# Patient Record
Sex: Male | Born: 1965
Health system: Southern US, Community
[De-identification: ages and names within clinical notes are randomized; demographics above are authoritative.]

## PROBLEM LIST (undated history)

## (undated) DIAGNOSIS — I10 Essential (primary) hypertension: Secondary | ICD-10-CM

## (undated) HISTORY — DX: Essential (primary) hypertension: I10

---

## 1988-03-10 HISTORY — PX: HERNIA REPAIR: SHX51

## 2008-03-10 HISTORY — PX: SPINE SURGERY: SHX786

## 2008-12-07 ENCOUNTER — Ambulatory Visit (HOSPITAL_COMMUNITY): Admission: RE | Admit: 2008-12-07 | Discharge: 2008-12-08 | Payer: Self-pay | Admitting: Neurosurgery

## 2010-06-14 LAB — APTT: aPTT: 27 seconds (ref 24–37)

## 2010-06-14 LAB — URINALYSIS, ROUTINE W REFLEX MICROSCOPIC
Bilirubin Urine: NEGATIVE
Glucose, UA: NEGATIVE mg/dL
Hgb urine dipstick: NEGATIVE
Ketones, ur: NEGATIVE mg/dL
Nitrite: NEGATIVE
Protein, ur: NEGATIVE mg/dL
Specific Gravity, Urine: 1.02 (ref 1.005–1.030)
Urobilinogen, UA: 0.2 mg/dL (ref 0.0–1.0)
pH: 5.5 (ref 5.0–8.0)

## 2010-06-14 LAB — BASIC METABOLIC PANEL
BUN: 16 mg/dL (ref 6–23)
CO2: 27 mEq/L (ref 19–32)
Calcium: 9.8 mg/dL (ref 8.4–10.5)
Chloride: 99 mEq/L (ref 96–112)
Creatinine, Ser: 1.18 mg/dL (ref 0.4–1.5)
GFR calc Af Amer: >60 mL/min (ref >60)
GFR calc non Af Amer: >60 mL/min (ref >60)
Glucose, Bld: 119 mg/dL — ABNORMAL HIGH (ref 70–99)
Potassium: 3.9 mEq/L (ref 3.5–5.1)
Sodium: 137 mEq/L (ref 135–145)

## 2010-06-14 LAB — CBC
HCT: 38.2 % — ABNORMAL LOW (ref 39.0–52.0)
Hemoglobin: 12.9 g/dL — ABNORMAL LOW (ref 13.0–17.0)
MCHC: 33.7 g/dL (ref 30.0–36.0)
MCV: 91.4 fL (ref 78.0–100.0)
Platelets: 248 10*3/uL (ref 150–400)
RBC: 4.18 MIL/uL — ABNORMAL LOW (ref 4.22–5.81)
RDW: 12.9 % (ref 11.5–15.5)
WBC: 5 10*3/uL (ref 4.0–10.5)

## 2010-06-14 LAB — URINE MICROSCOPIC-ADD ON

## 2010-06-14 LAB — PROTIME-INR
INR: 0.9 (ref 0.00–1.49)
Prothrombin Time: 11.9 seconds (ref 11.6–15.2)

## 2012-06-04 ENCOUNTER — Other Ambulatory Visit: Payer: Self-pay | Admitting: *Deleted

## 2012-06-04 MED ORDER — AMLODIPINE BESYLATE 10 MG PO TABS: 10.0000 mg | ORAL_TABLET | Freq: Every day | ORAL | Status: DC

## 2012-06-07 ENCOUNTER — Other Ambulatory Visit: Payer: Self-pay

## 2012-06-07 MED ORDER — HYDROCHLOROTHIAZIDE 25 MG PO TABS: 25.0000 mg | ORAL_TABLET | Freq: Every day | ORAL | Status: DC

## 2012-08-05 ENCOUNTER — Telehealth: Payer: Self-pay | Admitting: Family Medicine

## 2012-08-06 MED ORDER — HYDROCHLOROTHIAZIDE 25 MG PO TABS: 25.0000 mg | ORAL_TABLET | Freq: Every day | ORAL | Status: DC

## 2012-08-06 NOTE — Telephone Encounter (Signed)
done

## 2012-09-29 ENCOUNTER — Other Ambulatory Visit: Payer: Self-pay | Admitting: Physician Assistant

## 2012-12-22 ENCOUNTER — Encounter: Payer: Self-pay | Admitting: Family Medicine

## 2012-12-22 ENCOUNTER — Ambulatory Visit (INDEPENDENT_AMBULATORY_CARE_PROVIDER_SITE_OTHER): Payer: 59 | Admitting: Family Medicine

## 2012-12-22 VITALS — BP 143/88 | HR 66 | Temp 98.6°F | Ht 71.0 in | Wt 275.4 lb

## 2012-12-22 DIAGNOSIS — I10 Essential (primary) hypertension: Secondary | ICD-10-CM

## 2012-12-22 DIAGNOSIS — Z0000 Encounter for general adult medical examination without abnormal findings: Secondary | ICD-10-CM

## 2012-12-22 DIAGNOSIS — M109 Gout, unspecified: Secondary | ICD-10-CM

## 2012-12-22 LAB — POCT CBC
Granulocyte percent: 60.1 %G (ref 37–80)
HCT, POC: 38.7 % — AB (ref 43.5–53.7)
Hemoglobin: 13 g/dL — AB (ref 14.1–18.1)
Lymph, poc: 1.9 (ref 0.6–3.4)
MCH, POC: 29.9 pg (ref 27–31.2)
MCHC: 33.6 g/dL (ref 31.8–35.4)
MCV: 88.9 fL (ref 80–97)
MPV: 6.7 fL (ref 0–99.8)
POC Granulocyte: 3.2 (ref 2–6.9)
POC LYMPH PERCENT: 35.7 %L (ref 10–50)
Platelet Count, POC: 263 10*3/uL (ref 142–424)
RBC: 4.4 M/uL — AB (ref 4.69–6.13)
RDW, POC: 13.4 %
WBC: 5.3 10*3/uL (ref 4.6–10.2)

## 2012-12-22 MED ORDER — ALLOPURINOL 100 MG PO TABS: 100.0000 mg | ORAL_TABLET | Freq: Every day | ORAL | Status: DC

## 2012-12-22 MED ORDER — HYDROCHLOROTHIAZIDE 25 MG PO TABS: 25.0000 mg | ORAL_TABLET | Freq: Every day | ORAL | Status: DC

## 2012-12-22 MED ORDER — AMLODIPINE BESYLATE 10 MG PO TABS: 10.0000 mg | ORAL_TABLET | Freq: Every day | ORAL | Status: DC

## 2012-12-22 MED ORDER — METOPROLOL TARTRATE 25 MG PO TABS: 25.0000 mg | ORAL_TABLET | Freq: Two times a day (BID) | ORAL | Status: DC

## 2012-12-22 NOTE — Patient Instructions (Signed)

## 2012-12-22 NOTE — Progress Notes (Signed)
  Subjective:    Patient ID: Frank Herrera, male    DOB: 10/02/1965, 47 y.o.   MRN: [id number]  HPI This 47 y.o. male presents for evaluation of CPE and follow up.  He denies any acute problems.   Review of Systems No chest pain, SOB, HA, dizziness, vision change, N/V, diarrhea, constipation, dysuria, urinary urgency or frequency, myalgias, arthralgias or rash.     Objective:   Physical Exam Vital signs noted  Well developed well nourished male.  HEENT - Head atraumatic Normocephalic                Eyes - PERRLA, Conjuctiva - clear Sclera- Clear EOMI                Ears - EAC's Wnl TM's Wnl Gross Hearing WNL                Nose - Nares patent                 Throat - oropharanx wnl Respiratory - Lungs CTA bilateral Cardiac - RRR S1 and S2 without murmur GI - Abdomen soft Nontender and bowel sounds active x 4 Extremities - No edema. Neuro - Grossly intact.       Assessment & Plan:  Gout - Plan: allopurinol (ZYLOPRIM) 100 MG tablet, Uric acid  Essential hypertension, benign - Plan: amLODipine (NORVASC) 10 MG tablet, hydrochlorothiazide (HYDRODIURIL) 25 MG tablet, metoprolol tartrate (LOPRESSOR) 25 MG tablet, POCT CBC, CMP14+EGFR  Routine general medical examination at a health care facility - Plan: POCT CBC, CMP14+EGFR, Lipid panel, Thyroid Panel With TSH, PSA, total and free  Deatra Canter FNP

## 2012-12-23 LAB — CMP14+EGFR
ALT: 31 IU/L (ref 0–44)
AST: 32 IU/L (ref 0–40)
Albumin/Globulin Ratio: 2 (ref 1.1–2.5)
Albumin: 4.8 g/dL (ref 3.5–5.5)
Alkaline Phosphatase: 87 IU/L (ref 39–117)
BUN/Creatinine Ratio: 15 (ref 9–20)
BUN: 15 mg/dL (ref 6–24)
CO2: 24 mmol/L (ref 18–29)
Calcium: 9.9 mg/dL (ref 8.7–10.2)
Chloride: 100 mmol/L (ref 97–108)
Creatinine, Ser: 1.02 mg/dL (ref 0.76–1.27)
GFR calc Af Amer: 102 mL/min/{1.73_m2} (ref >59)
GFR calc non Af Amer: 88 mL/min/{1.73_m2} (ref >59)
Globulin, Total: 2.4 g/dL (ref 1.5–4.5)
Glucose: 110 mg/dL — ABNORMAL HIGH (ref 65–99)
Potassium: 4.3 mmol/L (ref 3.5–5.2)
Sodium: 141 mmol/L (ref 134–144)
Total Bilirubin: 0.6 mg/dL (ref 0.0–1.2)
Total Protein: 7.2 g/dL (ref 6.0–8.5)

## 2012-12-23 LAB — LIPID PANEL
Chol/HDL Ratio: 3 ratio units (ref 0.0–5.0)
Cholesterol, Total: 175 mg/dL (ref 100–199)
HDL: 59 mg/dL (ref >39)
LDL Calculated: 98 mg/dL (ref 0–99)
Triglycerides: 88 mg/dL (ref 0–149)
VLDL Cholesterol Cal: 18 mg/dL (ref 5–40)

## 2012-12-23 LAB — THYROID PANEL WITH TSH
Free Thyroxine Index: >1.1 (ref 1.2–4.9)
T3 Uptake Ratio: >50 % — ABNORMAL HIGH (ref 24–39)
T4, Total: 2.2 ug/dL — ABNORMAL LOW (ref 4.5–12.0)
TSH: 2.17 u[IU]/mL (ref 0.450–4.500)

## 2012-12-23 LAB — PSA, TOTAL AND FREE
PSA, Free Pct: 35 %
PSA, Free: 0.07 ng/mL
PSA: 0.2 ng/mL (ref 0.0–4.0)

## 2012-12-23 LAB — URIC ACID: Uric Acid: 7.3 mg/dL (ref 3.7–8.6)

## 2012-12-27 ENCOUNTER — Other Ambulatory Visit: Payer: Self-pay | Admitting: Family Medicine

## 2012-12-27 DIAGNOSIS — M109 Gout, unspecified: Secondary | ICD-10-CM

## 2012-12-27 MED ORDER — ALLOPURINOL 100 MG PO TABS: 200.0000 mg | ORAL_TABLET | Freq: Every day | ORAL | Status: DC

## 2013-01-05 ENCOUNTER — Telehealth: Payer: Self-pay | Admitting: Family Medicine

## 2013-06-22 ENCOUNTER — Encounter: Payer: Self-pay | Admitting: Family Medicine

## 2013-06-22 ENCOUNTER — Ambulatory Visit (INDEPENDENT_AMBULATORY_CARE_PROVIDER_SITE_OTHER): Payer: 59 | Admitting: Family Medicine

## 2013-06-22 VITALS — BP 126/82 | HR 58 | Temp 98.1°F | Ht 71.0 in | Wt 279.8 lb

## 2013-06-22 DIAGNOSIS — M109 Gout, unspecified: Secondary | ICD-10-CM

## 2013-06-22 DIAGNOSIS — I10 Essential (primary) hypertension: Secondary | ICD-10-CM

## 2013-06-22 DIAGNOSIS — Z23 Encounter for immunization: Secondary | ICD-10-CM

## 2013-06-22 LAB — POCT CBC
Granulocyte percent: 56.3 %G (ref 37–80)
HCT, POC: 49.5 % (ref 43.5–53.7)
Hemoglobin: 15.8 g/dL (ref 14.1–18.1)
Lymph, poc: 1.6 (ref 0.6–3.4)
MCH, POC: 28.6 pg (ref 27–31.2)
MCHC: 31.8 g/dL (ref 31.8–35.4)
MCV: 89.8 fL (ref 80–97)
MPV: 7.5 fL (ref 0–99.8)
POC Granulocyte: 2.3 (ref 2–6.9)
POC LYMPH PERCENT: 39.6 %L (ref 10–50)
Platelet Count, POC: 185 10*3/uL (ref 142–424)
RBC: 5.5 M/uL (ref 4.69–6.13)
RDW, POC: 13.8 %
WBC: 4.1 10*3/uL — AB (ref 4.6–10.2)

## 2013-06-22 MED ORDER — ALLOPURINOL 100 MG PO TABS: 200.0000 mg | ORAL_TABLET | Freq: Every day | ORAL | Status: DC

## 2013-06-22 NOTE — Progress Notes (Signed)
   Subjective:    Patient ID: Frank Herrera, male    DOB: 09-22-1965, 48 y.o.   MRN: [id number]  HPI  This 48 y.o. male presents for evaluation of hypertension and gout.  He is not having any Gout sx's. His last uric acid was slightly elevated at 7.3.  He did not get his rx for allopurinol $RemoveBeforeD'100mg'MtfhckmTZVMahb$  2po qd and is only taking one a day. He is tolerating his bp medication and has no acute complaints.  Review of Systems    No chest pain, SOB, HA, dizziness, vision change, N/V, diarrhea, constipation, dysuria, urinary urgency or frequency, myalgias, arthralgias or rash.  Objective:   Physical Exam Vital signs noted  Well developed well nourished male.  HEENT - Head atraumatic Normocephalic                Eyes - PERRLA, Conjuctiva - clear Sclera- Clear EOMI                Ears - EAC's Wnl TM's Wnl Gross Hearing WNL                Nose - Nares patent                 Throat - oropharanx wnl Respiratory - Lungs CTA bilateral Cardiac - RRR S1 and S2 without murmur GI - Abdomen soft Nontender and bowel sounds active x 4 Extremities - No edema. Neuro - Grossly intact.       Assessment & Plan:  Gout - Plan: allopurinol (ZYLOPRIM) 100 MG tablet take 2 po qd and will check uric acid next visit.  Essential hypertension, benign - Plan: CMP14+EGFR, POCT CBC - Controlled and continue current regimen with HCTZ, Norvasc, an metoprolol.  Lysbeth Penner FNP

## 2013-06-23 LAB — CMP14+EGFR
ALT: 26 IU/L (ref 0–44)
AST: 28 IU/L (ref 0–40)
Albumin/Globulin Ratio: 2 (ref 1.1–2.5)
Albumin: 4.9 g/dL (ref 3.5–5.5)
Alkaline Phosphatase: 85 IU/L (ref 39–117)
BUN/Creatinine Ratio: 15 (ref 9–20)
BUN: 16 mg/dL (ref 6–24)
CO2: 25 mmol/L (ref 18–29)
Calcium: 9.9 mg/dL (ref 8.7–10.2)
Chloride: 98 mmol/L (ref 97–108)
Creatinine, Ser: 1.09 mg/dL (ref 0.76–1.27)
GFR calc Af Amer: 94 mL/min/{1.73_m2} (ref >59)
GFR calc non Af Amer: 81 mL/min/{1.73_m2} (ref >59)
Globulin, Total: 2.5 g/dL (ref 1.5–4.5)
Glucose: 108 mg/dL — ABNORMAL HIGH (ref 65–99)
Potassium: 4 mmol/L (ref 3.5–5.2)
Sodium: 140 mmol/L (ref 134–144)
Total Bilirubin: 0.5 mg/dL (ref 0.0–1.2)
Total Protein: 7.4 g/dL (ref 6.0–8.5)

## 2013-11-28 ENCOUNTER — Other Ambulatory Visit: Payer: Self-pay | Admitting: Family Medicine

## 2013-12-24 ENCOUNTER — Other Ambulatory Visit: Payer: Self-pay | Admitting: Family Medicine

## 2013-12-26 NOTE — Telephone Encounter (Signed)
Last seen 06/22/13 B Oxford

## 2013-12-28 ENCOUNTER — Other Ambulatory Visit: Payer: Self-pay | Admitting: Family Medicine

## 2013-12-29 NOTE — Telephone Encounter (Signed)
Last seen 06/22/13 B Oxford   

## 2014-01-20 ENCOUNTER — Other Ambulatory Visit: Payer: Self-pay | Admitting: Family Medicine

## 2014-01-29 ENCOUNTER — Other Ambulatory Visit: Payer: Self-pay | Admitting: Family Medicine

## 2014-01-30 NOTE — Telephone Encounter (Signed)
Refilled per protocol, next appt in December

## 2014-02-17 ENCOUNTER — Other Ambulatory Visit: Payer: Self-pay | Admitting: Family Medicine

## 2014-02-22 ENCOUNTER — Encounter: Payer: Self-pay | Admitting: Family Medicine

## 2014-02-22 ENCOUNTER — Encounter (INDEPENDENT_AMBULATORY_CARE_PROVIDER_SITE_OTHER): Payer: Self-pay

## 2014-02-22 ENCOUNTER — Ambulatory Visit (INDEPENDENT_AMBULATORY_CARE_PROVIDER_SITE_OTHER): Payer: 59 | Admitting: *Deleted

## 2014-02-22 ENCOUNTER — Ambulatory Visit (INDEPENDENT_AMBULATORY_CARE_PROVIDER_SITE_OTHER): Payer: 59 | Admitting: Family Medicine

## 2014-02-22 VITALS — BP 136/85 | HR 92 | Temp 97.7°F | Ht 71.0 in | Wt 281.0 lb

## 2014-02-22 DIAGNOSIS — M1000 Idiopathic gout, unspecified site: Secondary | ICD-10-CM

## 2014-02-22 DIAGNOSIS — B351 Tinea unguium: Secondary | ICD-10-CM

## 2014-02-22 DIAGNOSIS — Z23 Encounter for immunization: Secondary | ICD-10-CM

## 2014-02-22 MED ORDER — TERBINAFINE HCL 250 MG PO TABS: 250.0000 mg | ORAL_TABLET | Freq: Every day | ORAL | Status: DC

## 2014-02-22 NOTE — Progress Notes (Signed)
   Subjective:    Patient ID: Frank Herrera, male    DOB: 11/07/1965, 48 y.o.   MRN: [id number]  HPI Patient is here for6 month follow up.  He has hx of gout and hypertension. He has some toenail fungus.  Review of Systems  Constitutional: Negative for fever.  HENT: Negative for ear pain.   Eyes: Negative for discharge.  Respiratory: Negative for cough.   Cardiovascular: Negative for chest pain.  Gastrointestinal: Negative for abdominal distention.  Endocrine: Negative for polyuria.  Genitourinary: Negative for difficulty urinating.  Musculoskeletal: Negative for gait problem and neck pain.  Skin: Negative for color change and rash.  Neurological: Negative for speech difficulty and headaches.  Psychiatric/Behavioral: Negative for agitation.       Objective:    BP 136/85 mmHg  Pulse 92  Temp(Src) 97.7 F (36.5 C) (Oral)  Ht 5\' 11"  (1.803 m)  Wt 281 lb (127.461 kg)  BMI 39.21 kg/m2   Physical Exam  Constitutional: He is oriented to person, place, and time. He appears well-developed and well-nourished.  HENT:  Head: Normocephalic and atraumatic.  Mouth/Throat: Oropharynx is clear and moist.  Eyes: Pupils are equal, round, and reactive to light.  Neck: Normal range of motion. Neck supple.  Cardiovascular: Normal rate and regular rhythm.   No murmur heard. Pulmonary/Chest: Effort normal and breath sounds normal.  Abdominal: Soft. Bowel sounds are normal. There is no tenderness.  Neurological: He is alert and oriented to person, place, and time.  Skin: Skin is warm and dry.  Psychiatric: He has a normal mood and affect.          Assessment & Plan:     ICD-9-CM ICD-10-CM   1. Idiopathic gout, unspecified chronicity, unspecified site 274.9 M10.00 Uric acid  2. Onychomycosis 110.1 B35.1 terbinafine (LAMISIL) 250 MG tablet     Return in about 3 months (around 05/24/2014).  Deatra CanterWilliam J Ayaz Sondgeroth FNP

## 2014-02-23 LAB — URIC ACID: Uric Acid: 6.4 mg/dL (ref 3.7–8.6)

## 2014-02-24 ENCOUNTER — Telehealth: Payer: Self-pay | Admitting: Family Medicine

## 2014-02-24 NOTE — Telephone Encounter (Signed)
-----   Message from Deatra CanterWilliam J Oxford, FNP sent at 02/24/2014 12:18 PM EST ----- Uric acid slightly elevated and would not change allopurinol dose and avoid diet high in purines.

## 2014-02-24 NOTE — Telephone Encounter (Signed)
Pt aware of lab result

## 2014-02-25 ENCOUNTER — Other Ambulatory Visit: Payer: Self-pay | Admitting: Family Medicine

## 2014-02-27 ENCOUNTER — Telehealth: Payer: Self-pay

## 2014-02-27 NOTE — Telephone Encounter (Signed)
lEFT RESULTS ON HOME AM AS PER dpr OF 12/2012

## 2014-02-27 NOTE — Telephone Encounter (Signed)
-----   Message from William J Oxford, FNP sent at 02/24/2014 12:18 PM EST ----- °Uric acid slightly elevated and would not change allopurinol dose and avoid diet high in purines. °

## 2014-03-23 ENCOUNTER — Other Ambulatory Visit: Payer: Self-pay | Admitting: Family Medicine

## 2014-05-28 ENCOUNTER — Other Ambulatory Visit: Payer: Self-pay | Admitting: Family Medicine

## 2014-05-30 ENCOUNTER — Other Ambulatory Visit: Payer: Self-pay | Admitting: Family Medicine

## 2014-06-07 ENCOUNTER — Other Ambulatory Visit: Payer: Self-pay | Admitting: Family Medicine

## 2014-06-20 ENCOUNTER — Other Ambulatory Visit: Payer: Self-pay | Admitting: Family Medicine

## 2014-06-23 ENCOUNTER — Ambulatory Visit (INDEPENDENT_AMBULATORY_CARE_PROVIDER_SITE_OTHER): Payer: 59

## 2014-06-23 ENCOUNTER — Encounter: Payer: Self-pay | Admitting: Family Medicine

## 2014-06-23 ENCOUNTER — Ambulatory Visit (INDEPENDENT_AMBULATORY_CARE_PROVIDER_SITE_OTHER): Payer: 59 | Admitting: Family Medicine

## 2014-06-23 VITALS — BP 136/86 | HR 75 | Temp 97.7°F | Ht 71.0 in | Wt 276.4 lb

## 2014-06-23 DIAGNOSIS — R1010 Upper abdominal pain, unspecified: Secondary | ICD-10-CM | POA: Diagnosis not present

## 2014-06-23 LAB — POCT CBC
Granulocyte percent: 50.7 %G (ref 37–80)
HCT, POC: 42.2 % — AB (ref 43.5–53.7)
Hemoglobin: 13 g/dL — AB (ref 14.1–18.1)
Lymph, poc: 1.4 (ref 0.6–3.4)
MCH, POC: 27.7 pg (ref 27–31.2)
MCHC: 30.9 g/dL — AB (ref 31.8–35.4)
MCV: 89.7 fL (ref 80–97)
MPV: 6 fL (ref 0–99.8)
POC Granulocyte: 1.8 — AB (ref 2–6.9)
POC LYMPH PERCENT: 39.6 %L (ref 10–50)
Platelet Count, POC: 264 10*3/uL (ref 142–424)
RBC: 4.7 M/uL (ref 4.69–6.13)
RDW, POC: 13.5 %
WBC: 3.6 10*3/uL — AB (ref 4.6–10.2)

## 2014-06-23 MED ORDER — HYOSCYAMINE SULFATE 0.125 MG SL SUBL: 0.1250 mg | SUBLINGUAL_TABLET | SUBLINGUAL | Status: DC | PRN

## 2014-06-23 NOTE — Progress Notes (Signed)
Subjective:  Patient ID: Frank Herrera, male    DOB: [birth year]  Age: 49 y.o. MRN: [id number]  CC: Abdominal Pain   HPI Frank Herrera presents for 1 week of abdominal pain. Pain was rather mild through the week until this morning. He was at work about 9:00 this morning when he felt a tightness in his stomach the tingling became more intense and he got nauseous and felt something coming up in his throat as if he was given throughout but then it went down and he actually did not. He has not thrown up but has had the pain persisting now for several hours. 6 or 7 out of 10 pain today. Denies diarrhea. He's not been constipated he may skip a day every now and then though. No fever no chills no sweats. Normal appetite.  History Frank Herrera has a past medical history of Hypertension.   He has past surgical history that includes Spine surgery and Hernia repair.   His family history is not on file.He reports that he has never smoked. He does not have any smokeless tobacco history on file. He reports that he drinks alcohol. He reports that he does not use illicit drugs.  Current Outpatient Prescriptions on File Prior to Visit  Medication Sig Dispense Refill  . allopurinol (ZYLOPRIM) 100 MG tablet TAKE 2 TABLETS (200 MG TOTAL) BY MOUTH DAILY. 60 tablet 1  . amLODipine (NORVASC) 10 MG tablet TAKE 1 TABLET (10 MG TOTAL) BY MOUTH DAILY. 30 tablet 4  . hydrochlorothiazide (HYDRODIURIL) 25 MG tablet TAKE 1 TABLET (25 MG TOTAL) BY MOUTH DAILY. 30 tablet 2  . metoprolol tartrate (LOPRESSOR) 25 MG tablet TAKE 1 TABLET (25 MG TOTAL) BY MOUTH 2 (TWO) TIMES DAILY. 60 tablet 2  . terbinafine (LAMISIL) 250 MG tablet Take 1 tablet (250 mg total) by mouth daily. 30 tablet 3   No current facility-administered medications on file prior to visit.    ROS Review of Systems  Constitutional: Negative for fever, chills and diaphoresis.  Respiratory: Negative for cough and shortness of breath.   Cardiovascular: Negative  for chest pain.  Gastrointestinal: Positive for abdominal pain. Negative for nausea, vomiting, diarrhea, constipation, blood in stool and abdominal distention.  Genitourinary: Negative for dysuria, hematuria and flank pain.  Musculoskeletal: Negative for joint swelling and arthralgias.  Skin: Negative for rash.  Neurological: Negative for dizziness and weakness.  Psychiatric/Behavioral: The patient is not nervous/anxious.     Objective:  BP 136/86 mmHg  Pulse 75  Temp(Src) 97.7 F (36.5 C) (Oral)  Ht $R'5\' 11"'bH$  (1.803 m)  Wt 276 lb 6.4 oz (125.374 kg)  BMI 38.57 kg/m2  BP Readings from Last 3 Encounters:  06/23/14 136/86  02/22/14 136/85  06/22/13 126/82    Wt Readings from Last 3 Encounters:  06/23/14 276 lb 6.4 oz (125.374 kg)  02/22/14 281 lb (127.461 kg)  06/22/13 279 lb 12.8 oz (126.916 kg)     Physical Exam  Constitutional: He appears well-developed and well-nourished.  HENT:  Head: Normocephalic and atraumatic.  Right Ear: Tympanic membrane and external ear normal. No decreased hearing is noted.  Left Ear: Tympanic membrane and external ear normal. No decreased hearing is noted.  Mouth/Throat: No oropharyngeal exudate or posterior oropharyngeal erythema.  Eyes: Pupils are equal, round, and reactive to light.  Neck: Normal range of motion. Neck supple.  Cardiovascular: Normal rate and regular rhythm.   No murmur heard. Pulmonary/Chest: Breath sounds normal. No respiratory distress.  Abdominal: Soft. Bowel sounds  are normal. He exhibits no mass. There is no tenderness.  Vitals reviewed.   No results found for: HGBA1C  Lab Results  Component Value Date   WBC 3.6* 06/23/2014   HGB 13.0* 06/23/2014   HCT 42.2* 06/23/2014   PLT 248 12/07/2008   GLUCOSE 88 06/23/2014   CHOL 175 12/22/2012   TRIG 88 12/22/2012   HDL 59 12/22/2012   LDLCALC 98 12/22/2012   ALT 34 06/23/2014   AST 28 06/23/2014   NA 141 06/23/2014   K 3.8 06/23/2014   CL 98 06/23/2014    CREATININE 0.96 06/23/2014   BUN 13 06/23/2014   CO2 26 06/23/2014   TSH 2.170 12/22/2012   PSA 0.2 12/22/2012   INR 0.9 12/07/2008    Dg Chest 2 View  12/07/2008   Clinical Data: Lumbar HNP, preop.  Cough, congestion.   CHEST - 2 VIEW   Comparison: None   Findings: There are low lung volumes.  Heart is mildly enlarged. No confluent opacities, effusions or edema.  No acute bony abnormality.   IMPRESSION: Low lung volumes.  Mild cardiomegaly.  Provider: Anne Ng  Dg Lumbar Spine 2-3 Views  12/07/2008   Clinical Data: History given of lumbar herniated disc.  Left L4-L5 far left discectomy.   LUMBAR SPINE - 2-3 VIEW   Comparison: None   Findings: Portable lateral localization images were obtained.   Image obtained at 1100 hours shows the tip of a metallic instrument projecting over the spinous process of L4.   Portable cross-table examination at 1130 hours shows metallic instrument projecting at the L4-L5 level.   IMPRESSION: Localization images were obtained.  Provider: Mila Palmer, Lendon Collar   Assessment & Plan:   Frank Herrera was seen today for abdominal pain.  Diagnoses and all orders for this visit:  Pain of upper abdomen Orders: -     POCT CBC -     DG Abd 2 Views; Future -     CMP14+EGFR -     Amylase -     Lipase  Other orders -     hyoscyamine (LEVSIN/SL) 0.125 MG SL tablet; Place 1 tablet (0.125 mg total) under the tongue every 4 (four) hours as needed.   I am having Frank Herrera start on hyoscyamine. I am also having him maintain his terbinafine, amLODipine, hydrochlorothiazide, metoprolol tartrate, and allopurinol.  Meds ordered this encounter  Medications  . hyoscyamine (LEVSIN/SL) 0.125 MG SL tablet    Sig: Place 1 tablet (0.125 mg total) under the tongue every 4 (four) hours as needed.    Dispense:  30 tablet    Refill:  0     Follow-up: Return in about 1 month (around 07/23/2014).  [name], M.D.

## 2014-06-24 LAB — CMP14+EGFR
ALT: 34 IU/L (ref 0–44)
AST: 28 IU/L (ref 0–40)
Albumin/Globulin Ratio: 2 (ref 1.1–2.5)
Albumin: 5.1 g/dL (ref 3.5–5.5)
Alkaline Phosphatase: 99 IU/L (ref 39–117)
BUN/Creatinine Ratio: 14 (ref 9–20)
BUN: 13 mg/dL (ref 6–24)
Bilirubin Total: 0.7 mg/dL (ref 0.0–1.2)
CO2: 26 mmol/L (ref 18–29)
Calcium: 10 mg/dL (ref 8.7–10.2)
Chloride: 98 mmol/L (ref 97–108)
Creatinine, Ser: 0.96 mg/dL (ref 0.76–1.27)
GFR calc Af Amer: 108 mL/min/{1.73_m2} (ref >59)
GFR calc non Af Amer: 94 mL/min/{1.73_m2} (ref >59)
Globulin, Total: 2.5 g/dL (ref 1.5–4.5)
Glucose: 88 mg/dL (ref 65–99)
Potassium: 3.8 mmol/L (ref 3.5–5.2)
Sodium: 141 mmol/L (ref 134–144)
Total Protein: 7.6 g/dL (ref 6.0–8.5)

## 2014-06-24 LAB — LIPASE: Lipase: 41 U/L (ref 0–59)

## 2014-06-24 LAB — AMYLASE: Amylase: 65 U/L (ref 31–124)

## 2014-08-11 ENCOUNTER — Other Ambulatory Visit: Payer: Self-pay | Admitting: Family Medicine

## 2014-08-24 ENCOUNTER — Other Ambulatory Visit: Payer: Self-pay | Admitting: Family Medicine

## 2014-08-29 ENCOUNTER — Other Ambulatory Visit: Payer: Self-pay

## 2014-08-29 MED ORDER — AMLODIPINE BESYLATE 10 MG PO TABS: ORAL_TABLET | ORAL | Status: DC

## 2014-09-08 ENCOUNTER — Other Ambulatory Visit: Payer: Self-pay | Admitting: Family Medicine

## 2014-11-11 ENCOUNTER — Other Ambulatory Visit: Payer: Self-pay | Admitting: Family Medicine

## 2014-12-12 ENCOUNTER — Other Ambulatory Visit: Payer: Self-pay | Admitting: Family Medicine

## 2015-01-12 ENCOUNTER — Other Ambulatory Visit: Payer: Self-pay | Admitting: Family Medicine

## 2015-02-13 ENCOUNTER — Other Ambulatory Visit: Payer: Self-pay | Admitting: Family Medicine

## 2015-02-13 NOTE — Telephone Encounter (Signed)
Last seen 06/23/14  Dr Darlyn ReadStacks

## 2015-03-19 ENCOUNTER — Ambulatory Visit (INDEPENDENT_AMBULATORY_CARE_PROVIDER_SITE_OTHER): Payer: BLUE CROSS/BLUE SHIELD | Admitting: Family Medicine

## 2015-03-19 ENCOUNTER — Other Ambulatory Visit: Payer: Self-pay | Admitting: Family Medicine

## 2015-03-19 ENCOUNTER — Encounter: Payer: Self-pay | Admitting: Family Medicine

## 2015-03-19 VITALS — BP 134/85 | HR 66 | Temp 98.2°F | Ht 71.0 in | Wt 283.2 lb

## 2015-03-19 DIAGNOSIS — Z77011 Contact with and (suspected) exposure to lead: Secondary | ICD-10-CM

## 2015-03-19 DIAGNOSIS — I10 Essential (primary) hypertension: Secondary | ICD-10-CM | POA: Diagnosis not present

## 2015-03-19 DIAGNOSIS — M1000 Idiopathic gout, unspecified site: Secondary | ICD-10-CM | POA: Insufficient documentation

## 2015-03-19 DIAGNOSIS — B351 Tinea unguium: Secondary | ICD-10-CM

## 2015-03-19 HISTORY — DX: Contact with and (suspected) exposure to lead: Z77.011

## 2015-03-19 MED ORDER — ALLOPURINOL 100 MG PO TABS: ORAL_TABLET | ORAL | Status: DC

## 2015-03-19 MED ORDER — HYDROCHLOROTHIAZIDE 25 MG PO TABS: ORAL_TABLET | ORAL | Status: DC

## 2015-03-19 MED ORDER — CICLOPIROX 8 % EX SOLN: Freq: Every day | CUTANEOUS | Status: DC

## 2015-03-19 MED ORDER — AMLODIPINE BESYLATE 10 MG PO TABS: ORAL_TABLET | ORAL | Status: DC

## 2015-03-19 MED ORDER — METOPROLOL TARTRATE 25 MG PO TABS
25.0000 mg | ORAL_TABLET | Freq: Two times a day (BID) | ORAL | Status: DC
Start: 2015-03-19 — End: 2015-04-19

## 2015-03-19 NOTE — Progress Notes (Signed)
Subjective:  Patient ID: Frank Herrera, male    DOB: 03/21/1965  Age: 50 y.o. MRN: [id number]  CC: Hypertension and Gout   HPI Frank Herrera presents for  follow-up of hypertension. Patient has no history of headache chest pain or shortness of breath or recent cough. Patient also denies symptoms of TIA such as numbness weakness lateralizing. Patient checks  blood pressure at home and has not had any elevated readings recently. Patient denies side effects from his medication. States taking it regularly.   History Frank Herrera has a past medical history of Hypertension.   He has past surgical history that includes Spine surgery and Hernia repair.   His family history is not on file.He reports that he has never smoked. He does not have any smokeless tobacco history on file. He reports that he drinks alcohol. He reports that he does not use illicit drugs.  Current Outpatient Prescriptions on File Prior to Visit  Medication Sig Dispense Refill  . hyoscyamine (LEVSIN/SL) 0.125 MG SL tablet Place 1 tablet (0.125 mg total) under the tongue every 4 (four) hours as needed. 30 tablet 0   No current facility-administered medications on file prior to visit.    ROS Review of Systems  Constitutional: Negative for fever, chills, diaphoresis and unexpected weight change.  HENT: Negative for congestion, hearing loss, rhinorrhea and sore throat.   Eyes: Negative for visual disturbance.  Respiratory: Negative for cough and shortness of breath.   Cardiovascular: Negative for chest pain.  Gastrointestinal: Negative for abdominal pain, diarrhea and constipation.  Genitourinary: Negative for dysuria and flank pain.  Musculoskeletal: Negative for joint swelling and arthralgias.  Skin: Negative for rash.  Neurological: Negative for dizziness and headaches.  Psychiatric/Behavioral: Negative for sleep disturbance and dysphoric mood.    Objective:  BP 134/85 mmHg  Pulse 66  Temp(Src) 98.2 F (36.8 C)  (Oral)  Ht 5' 11" (1.803 m)  Wt 283 lb 3.2 oz (128.459 kg)  BMI 39.52 kg/m2  SpO2 98%  BP Readings from Last 3 Encounters:  03/19/15 134/85  06/23/14 136/86  02/22/14 136/85    Wt Readings from Last 3 Encounters:  03/19/15 283 lb 3.2 oz (128.459 kg)  06/23/14 276 lb 6.4 oz (125.374 kg)  02/22/14 281 lb (127.461 kg)     Physical Exam  Constitutional: He is oriented to person, place, and time. He appears well-developed and well-nourished. No distress.  HENT:  Head: Normocephalic and atraumatic.  Right Ear: External ear normal.  Left Ear: External ear normal.  Nose: Nose normal.  Mouth/Throat: Oropharynx is clear and moist.  Eyes: Conjunctivae and EOM are normal. Pupils are equal, round, and reactive to light.  Neck: Normal range of motion. Neck supple. No thyromegaly present.  Cardiovascular: Normal rate, regular rhythm and normal heart sounds.   No murmur heard. Pulmonary/Chest: Effort normal and breath sounds normal. No respiratory distress. He has no wheezes. He has no rales.  Abdominal: Soft. Bowel sounds are normal. He exhibits no distension. There is no tenderness.  Lymphadenopathy:    He has no cervical adenopathy.  Neurological: He is alert and oriented to person, place, and time. He has normal reflexes.  Skin: Skin is warm and dry.  Psychiatric: He has a normal mood and affect. His behavior is normal. Judgment and thought content normal.    No results found for: HGBA1C  Lab Results  Component Value Date   WBC 3.6* 06/23/2014   HGB 13.0* 06/23/2014   HCT 42.2* 06/23/2014  PLT 248 12/07/2008   GLUCOSE 88 06/23/2014   CHOL 175 12/22/2012   TRIG 88 12/22/2012   HDL 59 12/22/2012   LDLCALC 98 12/22/2012   ALT 34 06/23/2014   AST 28 06/23/2014   NA 141 06/23/2014   K 3.8 06/23/2014   CL 98 06/23/2014   CREATININE 0.96 06/23/2014   BUN 13 06/23/2014   CO2 26 06/23/2014   TSH 2.170 12/22/2012   PSA 0.2 12/22/2012   INR 0.9 12/07/2008    Dg Chest 2  View  12/07/2008  Clinical Data: Lumbar HNP, preop.  Cough, congestion.  CHEST - 2 VIEW  Comparison: None  Findings: There are low lung volumes.  Heart is mildly enlarged. No confluent opacities, effusions or edema.  No acute bony abnormality.  IMPRESSION: Low lung volumes.  Mild cardiomegaly. Provider: Anne Ng  Dg Lumbar Spine 2-3 Views  12/07/2008  Clinical Data: History given of lumbar herniated disc.  Left L4-L5 far left discectomy.  LUMBAR SPINE - 2-3 VIEW  Comparison: None  Findings: Portable lateral localization images were obtained.  Image obtained at 1100 hours shows the tip of a metallic instrument projecting over the spinous process of L4.  Portable cross-table examination at 1130 hours shows metallic instrument projecting at the L4-L5 level.  IMPRESSION: Localization images were obtained. Provider: Mila Palmer, Lendon Collar   Assessment & Plan:   Frank Herrera was seen today for hypertension and gout.  Diagnoses and all orders for this visit:  Essential hypertension -     CBC with Differential/Platelet -     CMP14+EGFR -     Lipid panel  Idiopathic gout, unspecified chronicity, unspecified site  Onychomycosis -     ciclopirox (PENLAC) 8 % solution; Apply topically at bedtime. Apply over nail and surrounding skin. Apply daily over previous coat. After seven (7) days, may remove with alcohol and continue cycle.  Lead exposure -     Cancel: Lead, blood (adult age 76 yrs or greater) -     Lead, blood (adult age 1 yrs or greater)  Morbid obesity due to excess calories (HCC)  Other orders -     Discontinue: hydrochlorothiazide (HYDRODIURIL) 25 MG tablet; TAKE 1 TABLET (25 MG TOTAL) BY MOUTH DAILY. -     Discontinue: amLODipine (NORVASC) 10 MG tablet; TAKE 1 TABLET (10 MG TOTAL) BY MOUTH DAILY. -     Discontinue: allopurinol (ZYLOPRIM) 100 MG tablet; TAKE 2 TABLETS (200 MG TOTAL) BY MOUTH DAILY. -     allopurinol (ZYLOPRIM) 100 MG tablet; TAKE 2 TABLETS (200 MG TOTAL) BY  MOUTH DAILY. -     amLODipine (NORVASC) 10 MG tablet; TAKE 1 TABLET (10 MG TOTAL) BY MOUTH DAILY. -     hydrochlorothiazide (HYDRODIURIL) 25 MG tablet; TAKE 1 TABLET (25 MG TOTAL) BY MOUTH DAILY. -     metoprolol tartrate (LOPRESSOR) 25 MG tablet; Take 1 tablet (25 mg total) by mouth 2 (two) times daily.   I have discontinued Mr. Curl terbinafine. I am also having him start on ciclopirox. Additionally, I am having him maintain his hyoscyamine, allopurinol, amLODipine, hydrochlorothiazide, and metoprolol tartrate.  Meds ordered this encounter  Medications  . DISCONTD: hydrochlorothiazide (HYDRODIURIL) 25 MG tablet    Sig: TAKE 1 TABLET (25 MG TOTAL) BY MOUTH DAILY.    Dispense:  90 tablet    Refill:  3  . DISCONTD: amLODipine (NORVASC) 10 MG tablet    Sig: TAKE 1 TABLET (10 MG TOTAL) BY MOUTH DAILY.    Dispense:  30 tablet    Refill:  3  . DISCONTD: allopurinol (ZYLOPRIM) 100 MG tablet    Sig: TAKE 2 TABLETS (200 MG TOTAL) BY MOUTH DAILY.    Dispense:  60 tablet    Refill:  1  . allopurinol (ZYLOPRIM) 100 MG tablet    Sig: TAKE 2 TABLETS (200 MG TOTAL) BY MOUTH DAILY.    Dispense:  60 tablet    Refill:  5  . amLODipine (NORVASC) 10 MG tablet    Sig: TAKE 1 TABLET (10 MG TOTAL) BY MOUTH DAILY.    Dispense:  30 tablet    Refill:  5  . hydrochlorothiazide (HYDRODIURIL) 25 MG tablet    Sig: TAKE 1 TABLET (25 MG TOTAL) BY MOUTH DAILY.    Dispense:  90 tablet    Refill:  3  . metoprolol tartrate (LOPRESSOR) 25 MG tablet    Sig: Take 1 tablet (25 mg total) by mouth 2 (two) times daily.    Dispense:  60 tablet    Refill:  5  . ciclopirox (PENLAC) 8 % solution    Sig: Apply topically at bedtime. Apply over nail and surrounding skin. Apply daily over previous coat. After seven (7) days, may remove with alcohol and continue cycle.    Dispense:  6.6 mL    Refill:  0     Follow-up: Return in about 6 months (around 09/16/2015) for CPE, hypertension.  [name], M.D.

## 2015-03-19 NOTE — Patient Instructions (Signed)

## 2015-03-20 LAB — LIPID PANEL
Chol/HDL Ratio: 3.7 ratio units (ref 0.0–5.0)
Cholesterol, Total: 188 mg/dL (ref 100–199)
HDL: 51 mg/dL (ref >39)
LDL Calculated: 118 mg/dL — ABNORMAL HIGH (ref 0–99)
Triglycerides: 94 mg/dL (ref 0–149)
VLDL Cholesterol Cal: 19 mg/dL (ref 5–40)

## 2015-03-20 LAB — CMP14+EGFR
ALT: 28 IU/L (ref 0–44)
AST: 25 IU/L (ref 0–40)
Albumin/Globulin Ratio: 1.6 (ref 1.1–2.5)
Albumin: 4.4 g/dL (ref 3.5–5.5)
Alkaline Phosphatase: 90 IU/L (ref 39–117)
BUN/Creatinine Ratio: 12 (ref 9–20)
BUN: 12 mg/dL (ref 6–24)
Bilirubin Total: 0.6 mg/dL (ref 0.0–1.2)
CO2: 25 mmol/L (ref 18–29)
Calcium: 9.6 mg/dL (ref 8.7–10.2)
Chloride: 98 mmol/L (ref 96–106)
Creatinine, Ser: 1.04 mg/dL (ref 0.76–1.27)
GFR calc Af Amer: 98 mL/min/{1.73_m2} (ref >59)
GFR calc non Af Amer: 85 mL/min/{1.73_m2} (ref >59)
Globulin, Total: 2.8 g/dL (ref 1.5–4.5)
Glucose: 95 mg/dL (ref 65–99)
Potassium: 4.1 mmol/L (ref 3.5–5.2)
Sodium: 140 mmol/L (ref 134–144)
Total Protein: 7.2 g/dL (ref 6.0–8.5)

## 2015-03-20 LAB — CBC WITH DIFFERENTIAL/PLATELET
Basophils Absolute: 0 10*3/uL (ref 0.0–0.2)
Basos: 1 %
EOS (ABSOLUTE): 0.2 10*3/uL (ref 0.0–0.4)
Eos: 4 %
Hematocrit: 38.2 % (ref 37.5–51.0)
Hemoglobin: 13.3 g/dL (ref 12.6–17.7)
Immature Grans (Abs): 0 10*3/uL (ref 0.0–0.1)
Immature Granulocytes: 0 %
Lymphocytes Absolute: 1.7 10*3/uL (ref 0.7–3.1)
Lymphs: 40 %
MCH: 30.1 pg (ref 26.6–33.0)
MCHC: 34.8 g/dL (ref 31.5–35.7)
MCV: 86 fL (ref 79–97)
Monocytes Absolute: 0.4 10*3/uL (ref 0.1–0.9)
Monocytes: 11 %
Neutrophils Absolute: 1.9 10*3/uL (ref 1.4–7.0)
Neutrophils: 44 %
Platelets: 282 10*3/uL (ref 150–379)
RBC: 4.42 x10E6/uL (ref 4.14–5.80)
RDW: 12.9 % (ref 12.3–15.4)
WBC: 4.1 10*3/uL (ref 3.4–10.8)

## 2015-03-21 LAB — LEAD, BLOOD (ADULT >= 16 YRS): Lead-Whole Blood: NOT DETECTED ug/dL (ref 0–19)

## 2015-04-03 ENCOUNTER — Ambulatory Visit (INDEPENDENT_AMBULATORY_CARE_PROVIDER_SITE_OTHER): Payer: BLUE CROSS/BLUE SHIELD | Admitting: Family Medicine

## 2015-04-03 ENCOUNTER — Encounter: Payer: Self-pay | Admitting: Family Medicine

## 2015-04-03 ENCOUNTER — Ambulatory Visit (INDEPENDENT_AMBULATORY_CARE_PROVIDER_SITE_OTHER): Payer: BLUE CROSS/BLUE SHIELD

## 2015-04-03 VITALS — BP 128/87 | HR 62 | Temp 98.6°F | Ht 71.0 in | Wt 279.8 lb

## 2015-04-03 DIAGNOSIS — M25552 Pain in left hip: Secondary | ICD-10-CM

## 2015-04-03 MED ORDER — DICLOFENAC SODIUM 75 MG PO TBEC: 75.0000 mg | DELAYED_RELEASE_TABLET | Freq: Two times a day (BID) | ORAL | Status: DC

## 2015-04-03 NOTE — Progress Notes (Signed)
Subjective:  Patient ID: Frank Herrera, male    DOB: [birth year]  Age: 50 y.o. MRN: [id number]  CC: Hip Pain   HPI Frank Herrera presents for left hip pain for 6 mos. Accelerating over last 2 months. Popping with flexion over acetabulum (Pt. Points) Causes a limp.    History Frank Herrera has a past medical history of Hypertension.   He has past surgical history that includes Spine surgery and Hernia repair.   His family history is not on file.He reports that he has never smoked. He does not have any smokeless tobacco history on file. He reports that he drinks alcohol. He reports that he does not use illicit drugs.    ROS Review of Systems  Constitutional: Negative for fever, chills and diaphoresis.  HENT: Negative for rhinorrhea and sore throat.   Respiratory: Negative for cough and shortness of breath.   Cardiovascular: Negative for chest pain.  Gastrointestinal: Negative for abdominal pain.  Musculoskeletal: Positive for arthralgias.  Skin: Negative for rash.  Neurological: Negative for weakness and headaches.    Objective:  BP 128/87 mmHg  Pulse 62  Temp(Src) 98.6 F (37 C) (Oral)  Ht  (1.803 m)  Wt 279 lb 12.8 oz (126.916 kg)  BMI 39.04 kg/m2  SpO2 100%  BP Readings from Last 3 Encounters:  04/03/15 128/87  03/19/15 134/85  06/23/14 136/86    Wt Readings from Last 3 Encounters:  04/03/15 279 lb 12.8 oz (126.916 kg)  03/19/15 283 lb 3.2 oz (128.459 kg)  06/23/14 276 lb 6.4 oz (125.374 kg)     Physical Exam  Constitutional: He appears well-developed and well-nourished.  HENT:  Head: Normocephalic and atraumatic.  Right Ear: Tympanic membrane and external ear normal. No decreased hearing is noted.  Left Ear: Tympanic membrane and external ear normal. No decreased hearing is noted.  Mouth/Throat: No oropharyngeal exudate or posterior oropharyngeal erythema.  Eyes: Pupils are equal, round, and reactive to light.  Neck: Normal range of motion. Neck  supple.  Cardiovascular: Normal rate and regular rhythm.   No murmur heard. Pulmonary/Chest: Effort normal and breath sounds normal. No respiratory distress.  Abdominal: Soft. Bowel sounds are normal. He exhibits no mass. There is no tenderness.  Musculoskeletal: He exhibits tenderness (at left hip over the greater trochanter laterally).  Vitals reviewed.    Lab Results  Component Value Date   WBC 4.1 03/19/2015   HGB 13.0* 06/23/2014   HCT 38.2 03/19/2015   PLT 282 03/19/2015   GLUCOSE 95 03/19/2015   CHOL 188 03/19/2015   TRIG 94 03/19/2015   HDL 51 03/19/2015   LDLCALC 118* 03/19/2015   ALT 28 03/19/2015   AST 25 03/19/2015   NA 140 03/19/2015   K 4.1 03/19/2015   CL 98 03/19/2015   CREATININE 1.04 03/19/2015   BUN 12 03/19/2015   CO2 25 03/19/2015   TSH 2.170 12/22/2012   PSA 0.2 12/22/2012   INR 0.9 12/07/2008    Dg Chest 2 View  12/07/2008  Clinical Data: Lumbar HNP, preop.  Cough, congestion.  CHEST - 2 VIEW  Comparison: None  Findings: There are low lung volumes.  Heart is mildly enlarged. No confluent opacities, effusions or edema.  No acute bony abnormality.  IMPRESSION: Low lung volumes.  Mild cardiomegaly. Provider: Sherol Dade  Dg Lumbar Spine 2-3 Views  12/07/2008  Clinical Data: History given of lumbar herniated disc.  Left L4-L5 far left discectomy.  LUMBAR SPINE - 2-3 VIEW  Comparison: None  Findings: Portable lateral localization images were obtained.  Image obtained at 1100 hours shows the tip of a metallic instrument projecting over the spinous process of L4.  Portable cross-table examination at 1130 hours shows metallic instrument projecting at the L4-L5 level.  IMPRESSION: Localization images were obtained. Provider: Rachael Darby, Wendi Maya   Assessment & Plan:   Frank Herrera was seen today for hip pain.  Diagnoses and all orders for this visit:  Pain in left hip -     DG Pelvis 1-2 Views; Future -     Ambulatory referral to Physical  Therapy  Morbid obesity due to excess calories (HCC) -     Ambulatory referral to Physical Therapy  Other orders -     diclofenac (VOLTAREN) 75 MG EC tablet; Take 1 tablet (75 mg total) by mouth 2 (two) times daily.      I am having Mr. Debes start on diclofenac. I am also having him maintain his hyoscyamine, allopurinol, amLODipine, hydrochlorothiazide, metoprolol tartrate, and ciclopirox.  Meds ordered this encounter  Medications  . diclofenac (VOLTAREN) 75 MG EC tablet    Sig: Take 1 tablet (75 mg total) by mouth 2 (two) times daily.    Dispense:  60 tablet    Refill:  2     Follow-up: Return in about 2 weeks (around 04/17/2015).  Mechele Claude, M.D.

## 2015-04-17 ENCOUNTER — Ambulatory Visit: Payer: BLUE CROSS/BLUE SHIELD | Attending: Family Medicine | Admitting: Physical Therapy

## 2015-04-17 DIAGNOSIS — R29898 Other symptoms and signs involving the musculoskeletal system: Secondary | ICD-10-CM | POA: Diagnosis not present

## 2015-04-17 DIAGNOSIS — M259 Joint disorder, unspecified: Secondary | ICD-10-CM | POA: Diagnosis present

## 2015-04-17 DIAGNOSIS — R531 Weakness: Secondary | ICD-10-CM | POA: Diagnosis present

## 2015-04-17 DIAGNOSIS — R2681 Unsteadiness on feet: Secondary | ICD-10-CM

## 2015-04-17 NOTE — Therapy (Signed)
[facility] [address] [place], [place], [address] Phone: [phone]   Fax:  [phone]  Physical Therapy Evaluation  Patient Details  Name: Frank Herrera MRN: [id number] Date of Birth: September 04, 1965 Referring Provider: Mechele Claude MD  Encounter Date: 04/17/2015      PT End of Session - 04/17/15 1237    Visit Number 1   Number of Visits 12   Date for PT Re-Evaluation 06/05/15   PT Start Time 0903   PT Stop Time 0950   PT Time Calculation (min) 47 min   Activity Tolerance Patient tolerated treatment well   Behavior During Therapy [facility] for tasks assessed/performed      Past Medical History  Diagnosis Date  . Hypertension     Past Surgical History  Procedure Laterality Date  . Spine surgery    . Hernia repair      There were no vitals filed for this visit.  Visit Diagnosis:  Weakness of right hip - Plan: PT plan of care cert/re-cert  Weakness - Plan: PT plan of care cert/re-cert  Ankle weakness - Plan: PT plan of care cert/re-cert  Unsteadiness - Plan: PT plan of care cert/re-cert      Subjective Assessment - 04/17/15 1223    Subjective I have to be careful how I walk or I will fall.   Limitations Walking   Patient Stated Goals Want to increase my left leg strength.   Currently in Pain? No/denies            [facility] PT Assessment - 04/17/15 0001    Assessment   Medical Diagnosis Pain in left hip.   Referring Provider Mechele Claude MD   Onset Date/Surgical Date --  6-8 months.   Precautions   Precautions Fall   Restrictions   Weight Bearing Restrictions No   Balance Screen   Has the patient fallen in the past 6 months Yes   How many times? 4   Has the patient had a decrease in activity level because of a fear of falling?  Yes   Is the patient reluctant to leave their home because of a fear of falling?  Yes   Home Environment   Living Environment Private residence   Prior Function   Level of  Independence Independent   Cognition   Overall Cognitive Status Within Functional Limits for tasks assessed   Posture/Postural Control   Posture/Postural Control No significant limitations   ROM / Strength   AROM / PROM / Strength AROM;Strength   AROM   Overall AROM Comments Full active left hip range of motion.   Strength   Overall Strength Comments Right hip flexion and abduction= 3+/5; right knee flexion and extension= 4-/5 and right ankle dorsiflexion= 2/5.   Palpation   Palpation comment No palpable left hip pain.   Ambulation/Gait   Gait Pattern Decreased dorsiflexion - left;Left steppage;Scissoring;Ataxic;Trendelenburg;Poor foot clearance - left                   [facility] Adult PT Treatment/Exercise - 04/17/15 0001    Exercises   Exercises Knee/Hip   Knee/Hip Exercises: Aerobic   Nustep Level 3 x 15 minutes.                     PT Long Term Goals - 04/17/15 1233    PT LONG TERM GOAL #1   Title Eliminate a steppage gait.   Time 6   Period Weeks   Status New  PT LONG TERM GOAL #2   Title Right hip and knee strength= 4+/5.   Time 6   Period Weeks   Status New   PT LONG TERM GOAL #3   Title Right ankle strength= 4/5 to avoid toe drag during the gait cycle.     Time 6   Period Weeks   Status New   PT LONG TERM GOAL #4   Title Eliminate a Trendelenburg gait cycle.               Plan - 04/17/15 1224    Clinical Impression Statement The patient has had ongoing left LE weakness for several years.  He has  a related PMH of a lumbar surgery that included pain and weakness in his left LE.  He reports that over the last 6-8 months hisright LE symptoms have worsened.  He has had 4 occasions in which he has hit his left toe on the floor and tripped and fell.   Pt will benefit from skilled therapeutic intervention in order to improve on the following deficits Pain;Decreased activity tolerance;Decreased strength;Decreased coordination   Rehab  Potential Good   PT Frequency 2x / week   PT Duration 6 weeks   PT Treatment/Interventions Therapeutic exercise;Therapeutic activities;Gait training;Balance training;Neuromuscular re-education;Patient/family education   PT Next Visit Plan Left LE strengthening (ankle, knee and hip); ankle isolator; sdly hip abduction; "clamshell"; XTS side-stepping; knee machine; BOSU; dynadisc; wall slides.   Consulted and Agree with Plan of Care Patient         Problem List Patient Active Problem List   Diagnosis Date Noted  . Essential hypertension 03/19/2015  . Primary gout 03/19/2015  . Onychomycosis 03/19/2015  . Lead exposure 03/19/2015  . Severe obesity (BMI >= 40) (HCC) 03/19/2015    Frank Herrera, [place] MPT 04/17/2015, 12:58 PM  [facility] [address] [place], [place], [address] Phone: [phone]   Fax:  [phone]  Name: Frank Herrera MRN: [id number] Date of Birth: April 02, 1965

## 2015-04-19 ENCOUNTER — Other Ambulatory Visit: Payer: Self-pay | Admitting: *Deleted

## 2015-04-19 MED ORDER — METOPROLOL TARTRATE 25 MG PO TABS: 25.0000 mg | ORAL_TABLET | Freq: Two times a day (BID) | ORAL | Status: DC

## 2015-04-19 MED ORDER — AMLODIPINE BESYLATE 10 MG PO TABS: ORAL_TABLET | ORAL | Status: DC

## 2015-04-19 MED ORDER — ALLOPURINOL 100 MG PO TABS: ORAL_TABLET | ORAL | Status: DC

## 2015-04-20 ENCOUNTER — Ambulatory Visit: Payer: BLUE CROSS/BLUE SHIELD | Admitting: Physical Therapy

## 2015-04-20 ENCOUNTER — Encounter: Payer: Self-pay | Admitting: Physical Therapy

## 2015-04-20 DIAGNOSIS — R531 Weakness: Secondary | ICD-10-CM

## 2015-04-20 DIAGNOSIS — R29898 Other symptoms and signs involving the musculoskeletal system: Secondary | ICD-10-CM

## 2015-04-20 DIAGNOSIS — R2681 Unsteadiness on feet: Secondary | ICD-10-CM

## 2015-04-20 NOTE — Therapy (Signed)
[facility] [address] [place], [place], [address] Phone: [phone]   Fax:  [phone]  Physical Therapy Treatment  Patient Details  Name: Frank Herrera MRN: [id number] Date of Birth: 01/20/1966 Referring Provider: Mechele Claude MD  Encounter Date: 04/20/2015      PT End of Session - 04/20/15 0733    Visit Number 2   Number of Visits 12   Date for PT Re-Evaluation 06/05/15   PT Start Time 0732   PT Stop Time 0812   PT Time Calculation (min) 40 min   Activity Tolerance Patient tolerated treatment well   Behavior During Therapy [facility] for tasks assessed/performed      Past Medical History  Diagnosis Date  . Hypertension     Past Surgical History  Procedure Laterality Date  . Spine surgery    . Hernia repair      There were no vitals filed for this visit.  Visit Diagnosis:  Weakness of right hip  Weakness  Ankle weakness  Unsteadiness      Subjective Assessment - 04/20/15 0732    Subjective Reports still limping but has no pain.   Limitations Walking   Patient Stated Goals Want to increase my left leg strength.   Currently in Pain? No/denies            [facility] PT Assessment - 04/20/15 0001    Assessment   Medical Diagnosis Pain in left hip.   Next MD Visit unscheduled   Precautions   Precautions Fall   Restrictions   Weight Bearing Restrictions No                     [facility] Adult PT Treatment/Exercise - 04/20/15 0001    Exercises   Exercises Knee/Hip;Ankle   Knee/Hip Exercises: Aerobic   Nustep L4 x15 min   Knee/Hip Exercises: Seated   Long Arc Quad Strengthening;Left;2 sets;10 reps;Weights   Long Arc Quad Weight 3 lbs.   Marching AROM;Both;2 sets;10 reps   Knee/Hip Exercises: Supine   Short Arc Quad Sets Strengthening;Left;2 sets;10 reps   Short Arc Quad Sets Limitations 5 sec hold with 2#   Geologist, engineering sets;10 reps   Straight Leg Raises Strengthening;Left;2 sets;10  reps   Straight Leg Raise with External Rotation Strengthening;Left;2 sets;10 reps   Knee/Hip Exercises: Sidelying   Hip ABduction Strengthening;Left;2 sets;10 reps   Clams R clamshell 2x10 reps   Knee/Hip Exercises: Prone   Hamstring Curl 2 sets;10 reps   Hamstring Curl Limitations 3#   Hip Extension Strengthening;Left;2 sets;10 reps   Ankle Exercises: Seated   Other Seated Ankle Exercises Attempted ankle isolator and AROM L ankle DF but no muscle contraction    Other Seated Ankle Exercises Seated L ankle dynadisc in DF/Inv/Ev x7 min   Ankle Exercises: Supine   Other Supine Ankle Exercises AAROM L ankle DF x15 reps  no muscle contraction observed                     PT Long Term Goals - 04/17/15 1233    PT LONG TERM GOAL #1   Title Eliminate a steppage gait.   Time 6   Period Weeks   Status New   PT LONG TERM GOAL #2   Title Right hip and knee strength= 4+/5.   Time 6   Period Weeks   Status New   PT LONG TERM GOAL #3   Title Right ankle strength= 4/5 to avoid toe drag  during the gait cycle.     Time 6   Period Weeks   Status New   PT LONG TERM GOAL #4   Title Eliminate a Trendelenburg gait cycle.               Plan - 04/20/15 0814    Clinical Impression Statement Patient tolerated today's treatment well although he demonstrated increased weakness and lack of full L ankle control. Patient cannot recall a previous injury other than previous back surgery per patient report. Patient demonstrated decreased L dorsiflexion strength with lack of L Anterior Tibialis muscle action. With AROM L ankle DF in sitting as well as AROM and AAROM L ankle DF in supine lack of L Anterior Tibialis actvity or contraction was noted. Patient demostrated decreased L hip and knee strength as well although L ankle strength is the greatest deficit. Patient denied pain with any of the exercises completed during today's treatment. L ankle DF was attempted in sitting with Dynadisc in  efforts to increase L ankle DF and improve L Anterior Tibialis activity. L ankle evertor activity was noted near proximal attachment and minimal L Anterior Tibialis activity noted near the proximal attachment with Dynadisc activity. Patient verbalized understanding of attempting simillar exercise with towel as was completed with Dynadisc in clinic.   Pt will benefit from skilled therapeutic intervention in order to improve on the following deficits Pain;Decreased activity tolerance;Decreased strength;Decreased coordination   Rehab Potential Good   PT Frequency 2x / week   PT Duration 6 weeks   PT Treatment/Interventions Therapeutic exercise;Therapeutic activities;Gait training;Balance training;Neuromuscular re-education;Patient/family education   PT Next Visit Plan Continue LLE strengthening with focus on L ankle DF per MPT POC. Consider communicating with MPT regarding VMS to L Anterior Tibialis next treatment.   PT Home Exercise Plan HEP- towel L ankle ROM in sitting   Consulted and Agree with Plan of Care Patient        Problem List Patient Active Problem List   Diagnosis Date Noted  . Essential hypertension 03/19/2015  . Primary gout 03/19/2015  . Onychomycosis 03/19/2015  . Lead exposure 03/19/2015  . Severe obesity (BMI >= 40) (HCC) 03/19/2015    Evelene Croon, PTA 04/20/2015, 1:13 PM  [facility] [address] [place], [place], [address] Phone: [phone]   Fax:  [phone]  Name: Frank Herrera MRN: [id number] Date of Birth: 27-Jun-1965

## 2015-04-25 ENCOUNTER — Ambulatory Visit: Payer: BLUE CROSS/BLUE SHIELD | Admitting: Physical Therapy

## 2015-04-25 ENCOUNTER — Encounter: Payer: Self-pay | Admitting: Physical Therapy

## 2015-04-25 DIAGNOSIS — R531 Weakness: Secondary | ICD-10-CM

## 2015-04-25 DIAGNOSIS — R29898 Other symptoms and signs involving the musculoskeletal system: Secondary | ICD-10-CM | POA: Diagnosis not present

## 2015-04-25 DIAGNOSIS — R2681 Unsteadiness on feet: Secondary | ICD-10-CM

## 2015-04-25 NOTE — Therapy (Signed)
[facility] [address] [place], [place], [address] Phone: [phone]   Fax:  [phone]  Physical Therapy Treatment  Patient Details  Name: RIESE HELLARD MRN: [id number] Date of Birth: 1965/12/26 Referring Provider: Mechele Claude MD  Encounter Date: 04/25/2015      PT End of Session - 04/25/15 0811    Visit Number 3   Number of Visits 12   Date for PT Re-Evaluation 06/05/15   PT Start Time 0810   PT Stop Time 0851   PT Time Calculation (min) 41 min   Activity Tolerance Patient tolerated treatment well   Behavior During Therapy [facility] for tasks assessed/performed      Past Medical History  Diagnosis Date  . Hypertension     Past Surgical History  Procedure Laterality Date  . Spine surgery    . Hernia repair      There were no vitals filed for this visit.  Visit Diagnosis:  Weakness of right hip  Weakness  Ankle weakness  Unsteadiness      Subjective Assessment - 04/25/15 0812    Subjective Reports that he has been doing the exercises at home but has seen no improvement.   Limitations Walking   Patient Stated Goals Want to increase my left leg strength.   Currently in Pain? No/denies            [facility] PT Assessment - 04/25/15 0001    Assessment   Medical Diagnosis Pain in left hip.   Next MD Visit unscheduled   Precautions   Precautions Fall   Restrictions   Weight Bearing Restrictions No                     [facility] Adult PT Treatment/Exercise - 04/25/15 0001    Knee/Hip Exercises: Aerobic   Nustep L5 x15 min   Knee/Hip Exercises: Standing   Walking with Sports Cord Pink XTS semi circle x20 reps each   Knee/Hip Exercises: Seated   Long Arc Quad Strengthening;Left;10 reps;Weights;2 sets   Long Arc Quad Weight 4 lbs.   Knee/Hip Flexion x20 reps with 4# ankleweight to LLE   Knee/Hip Exercises: Supine   Short Arc Quad Sets Strengthening;Left;2 sets;10 reps   Short Arc Quad Sets  Limitations with ball squeeze and 4# weight to LLE   [employer] Strengthening;2 sets;10 reps   Straight Leg Raises Strengthening;Left;2 sets;10 reps   Straight Leg Raise with External Rotation Strengthening;Left;2 sets;10 reps   Knee/Hip Exercises: Sidelying   Hip ABduction Strengthening;Left;2 sets;10 reps   Clams L clamshell 2x10 reps   Knee/Hip Exercises: Prone   Hamstring Curl 2 sets;10 reps   Hamstring Curl Limitations 4#   Hip Extension Strengthening;Left;2 sets;10 reps   Ankle Exercises: Seated   Other Seated Ankle Exercises Seated rockerboard x5 min   Other Seated Ankle Exercises Seated L ankle dynadisc into PF/DF and circles x5 min                     PT Long Term Goals - 04/17/15 1233    PT LONG TERM GOAL #1   Title Eliminate a steppage gait.   Time 6   Period Weeks   Status New   PT LONG TERM GOAL #2   Title Right hip and knee strength= 4+/5.   Time 6   Period Weeks   Status New   PT LONG TERM GOAL #3   Title Right ankle strength= 4/5 to avoid toe drag during the  gait cycle.     Time 6   Period Weeks   Status New   PT LONG TERM GOAL #4   Title Eliminate a Trendelenburg gait cycle.               Plan - 04/25/15 0853    Clinical Impression Statement Patient tolerated today's treatment well with no reports of pain in LLE and continues to demonstrate DF weakness of L ankle. Patient has no current tingling or shooting pain into LLE per patient report but prior to low back surgery previous patient stated his pain ran along his L shin and had that approximately 6 months prior to surgery. All hip and knee exercises are completed well with no signifiant area of weakness. With seated ankle exercises patient continues to lack L DF activation and was notable decreased with DF while on rockerboard. With resisted Pink XTS semi circle walks patient's L ankle went into PF when not weightbearing on LLE. Patient denied pain only faitgue following treatment.   Pt will  benefit from skilled therapeutic intervention in order to improve on the following deficits Pain;Decreased activity tolerance;Decreased strength;Decreased coordination   Rehab Potential Good   PT Frequency 2x / week   PT Duration 6 weeks   PT Treatment/Interventions Therapeutic exercise;Therapeutic activities;Gait training;Balance training;Neuromuscular re-education;Patient/family education   PT Next Visit Plan Continue LLE strengthening with focus on L ankle DF per MPT POC. Consider communicating with MPT regarding VMS to L Anterior Tibialis next treatment.   PT Home Exercise Plan HEP- towel L ankle ROM in sitting   Consulted and Agree with Plan of Care Patient        Problem List Patient Active Problem List   Diagnosis Date Noted  . Essential hypertension 03/19/2015  . Primary gout 03/19/2015  . Onychomycosis 03/19/2015  . Lead exposure 03/19/2015  . Severe obesity (BMI >= 40) (HCC) 03/19/2015    Evelene Croon, PTA 04/25/2015, 8:58 AM  [facility] [address] [place], [place], [address] Phone: [phone]   Fax:  [phone]  Name: ANAY RATHE MRN: [id number] Date of Birth: May 25, 1965

## 2015-04-30 ENCOUNTER — Encounter: Payer: Self-pay | Admitting: Physical Therapy

## 2015-04-30 ENCOUNTER — Ambulatory Visit: Payer: BLUE CROSS/BLUE SHIELD | Admitting: Physical Therapy

## 2015-04-30 DIAGNOSIS — R531 Weakness: Secondary | ICD-10-CM

## 2015-04-30 DIAGNOSIS — R29898 Other symptoms and signs involving the musculoskeletal system: Secondary | ICD-10-CM

## 2015-04-30 DIAGNOSIS — R2681 Unsteadiness on feet: Secondary | ICD-10-CM

## 2015-04-30 NOTE — Therapy (Signed)
[facility] [address] [place], [place], [address] Phone: [phone]   Fax:  [phone]  Physical Therapy Treatment  Patient Details  Name: Frank Herrera MRN: [id number] Date of Birth: [birth year] Referring Provider: Mechele Claude MD  Encounter Date: 04/30/2015      PT End of Session - 04/30/15 0815    Visit Number 4   Number of Visits 12   Date for PT Re-Evaluation 06/05/15   PT Start Time 0815   PT Stop Time 0905   PT Time Calculation (min) 50 min   Activity Tolerance Patient tolerated treatment well   Behavior During Therapy [facility] for tasks assessed/performed      Past Medical History  Diagnosis Date  . Hypertension     Past Surgical History  Procedure Laterality Date  . Spine surgery    . Hernia repair      There were no vitals filed for this visit.  Visit Diagnosis:  Weakness of right hip  Weakness  Ankle weakness  Unsteadiness      Subjective Assessment - 04/30/15 0826    Subjective Has not returned to MD at this time secondary to having jury duty and having more PT before he sees him.   Limitations Walking   Patient Stated Goals Want to increase my left leg strength.   Currently in Pain? No/denies            [facility] PT Assessment - 04/30/15 0001    Assessment   Medical Diagnosis Pain in left hip.   Next MD Visit unscheduled   Precautions   Precautions Fall   Restrictions   Weight Bearing Restrictions No                     [facility] Adult PT Treatment/Exercise - 04/30/15 0001    Knee/Hip Exercises: Aerobic   Nustep L6 x15 min   Knee/Hip Exercises: Seated   Long Arc Quad Strengthening;Left;3 sets;10 reps;Weights   Long Arc Quad Weight 4 lbs.   Knee/Hip Flexion x20 reps with 4# ankleweight to LLE   Knee/Hip Exercises: Supine   Bridges Strengthening;2 sets;10 reps   Straight Leg Raises Strengthening;Left;2 sets;10 reps   Knee/Hip Exercises: Sidelying   Hip ABduction  Strengthening;Left;2 sets;10 reps   Clams L clamshell 2x10 reps   Knee/Hip Exercises: Prone   Hamstring Curl 2 sets;10 reps   Hamstring Curl Limitations 4#   Hip Extension Strengthening;Left;2 sets;10 reps   Modalities   Modalities Electrical Stimulation   Electrical Stimulation   Electrical Stimulation Location L Tibialis Anterior   Electrical Stimulation Action VMS   Electrical Stimulation Parameters 10/10, 50 pps, 100usec x15 min with L ankle DF   Electrical Stimulation Goals Strength   Ankle Exercises: Seated   Other Seated Ankle Exercises Seated rockerboard x5 min   Other Seated Ankle Exercises --   Ankle Exercises: Supine   Other Supine Ankle Exercises AROM L ankle DF with VMS x15 min                     PT Long Term Goals - 04/17/15 1233    PT LONG TERM GOAL #1   Title Eliminate a steppage gait.   Time 6   Period Weeks   Status New   PT LONG TERM GOAL #2   Title Right hip and knee strength= 4+/5.   Time 6   Period Weeks   Status New   PT LONG TERM GOAL #3   Title  Right ankle strength= 4/5 to avoid toe drag during the gait cycle.     Time 6   Period Weeks   Status New   PT LONG TERM GOAL #4   Title Eliminate a Trendelenburg gait cycle.               Plan - 04/30/15 0853    Clinical Impression Statement Patient tolerated today's treatment well with no reports of pain in LLE and continues to have lack of active L ankle DF. Patient has no L hip pain with any ADLs per patient report only difficulty with L ankle. Completed all L hip exercises well without pain or complaint of soreness with minimal VC with proper exercise. VMS to L Tibialis Anterior completed with MPT consent in efforts of improving L ankle DF. L Tibialis Anterior contraction was noted with VMS and active L ankle DF in supine and patient was encouraged to try exaggerating L ankle DF to improve ROM. Normal stimulation response noted following removal of the modality.  Educated ptient to  be aware of L ankle weakness following stimulation. Patient denied pain following today's treatment per patient report.   Pt will benefit from skilled therapeutic intervention in order to improve on the following deficits Pain;Decreased activity tolerance;Decreased strength;Decreased coordination   Rehab Potential Good   PT Frequency 2x / week   PT Duration 6 weeks   PT Treatment/Interventions Therapeutic exercise;Therapeutic activities;Gait training;Balance training;Neuromuscular re-education;Patient/family education;Electrical Stimulation   PT Next Visit Plan Continue LLE strengthening with focus on L ankle DF per MPT POC. Continue VMS to L anterior tib to improve strength and ROM.   PT Home Exercise Plan HEP- towel L ankle ROM in sitting   Consulted and Agree with Plan of Care Patient        Problem List Patient Active Problem List   Diagnosis Date Noted  . Essential hypertension 03/19/2015  . Primary gout 03/19/2015  . Onychomycosis 03/19/2015  . Lead exposure 03/19/2015  . Severe obesity (BMI >= 40) (HCC) 03/19/2015    Evelene Croon, PTA 04/30/2015, 9:10 AM  [facility] [address] [place], [place], [address] Phone: [phone]   Fax:  [phone]  Name: Frank Herrera MRN: [id number] Date of Birth: May 31, 1965

## 2015-05-03 ENCOUNTER — Ambulatory Visit: Payer: BLUE CROSS/BLUE SHIELD | Admitting: Physical Therapy

## 2015-05-03 ENCOUNTER — Encounter: Payer: Self-pay | Admitting: Physical Therapy

## 2015-05-03 DIAGNOSIS — R531 Weakness: Secondary | ICD-10-CM

## 2015-05-03 DIAGNOSIS — R2681 Unsteadiness on feet: Secondary | ICD-10-CM

## 2015-05-03 DIAGNOSIS — R29898 Other symptoms and signs involving the musculoskeletal system: Secondary | ICD-10-CM | POA: Diagnosis not present

## 2015-05-03 NOTE — Therapy (Signed)
[facility] [address] [place], [place], [address] Phone: [phone]   Fax:  [phone]  Physical Therapy Treatment  Patient Details  Name: DEUCE PATERNOSTER MRN: [id number] Date of Birth: December 10, 1965 Referring Provider: Mechele Claude MD  Encounter Date: 05/03/2015      PT End of Session - 05/03/15 0850    Visit Number 5   Number of Visits 12   Date for PT Re-Evaluation 06/05/15   PT Start Time 0814   PT Stop Time 0900   PT Time Calculation (min) 46 min   Activity Tolerance Patient tolerated treatment well   Behavior During Therapy [facility] for tasks assessed/performed      Past Medical History  Diagnosis Date  . Hypertension     Past Surgical History  Procedure Laterality Date  . Spine surgery    . Hernia repair      There were no vitals filed for this visit.  Visit Diagnosis:  Weakness of right hip  Weakness  Ankle weakness  Unsteadiness      Subjective Assessment - 05/03/15 0819    Subjective Patient reported doing good after last treatment and has no pain complaints.    Limitations Walking   Patient Stated Goals Want to increase my left leg strength.   Currently in Pain? No/denies                         [facility] Adult PT Treatment/Exercise - 05/03/15 0001    Knee/Hip Exercises: Aerobic   Nustep L6 x15 min   Knee/Hip Exercises: Machines for Strengthening   Cybex Knee Extension 20# 3x10   Cybex Knee Flexion 40# 3x10   Knee/Hip Exercises: Standing   Walking with Sports Cord Pink XTS semi circle x20 reps each   Electrical Stimulation   Electrical Stimulation Location L Tibialis Anterior   Electrical Stimulation Action VMS   Electrical Stimulation Parameters 10/10 x55min with self AROM for DF with orange strap to assist    Electrical Stimulation Goals Strength;Neuromuscular facilitation                PT Education - 05/03/15 0856    Education provided Yes   Education Details HEP   Person(s) Educated Patient   Methods Explanation;Demonstration;Handout   Comprehension Verbalized understanding;Returned demonstration             PT Long Term Goals - 04/17/15 1233    PT LONG TERM GOAL #1   Title Eliminate a steppage gait.   Time 6   Period Weeks   Status New   PT LONG TERM GOAL #2   Title Right hip and knee strength= 4+/5.   Time 6   Period Weeks   Status New   PT LONG TERM GOAL #3   Title Right ankle strength= 4/5 to avoid toe drag during the gait cycle.     Time 6   Period Weeks   Status New   PT LONG TERM GOAL #4   Title Eliminate a Trendelenburg gait cycle.               Plan - 05/03/15 0856    Clinical Impression Statement Patient progressing with all activities. Patient has no pain reports and feels good progress with strength exercises thus far. Patient has episodes of falling to floor due to foot weakness. Patient was given HEP for strengthenng today. Unable to meet any further goals dur to weakness.   Pt will benefit from skilled therapeutic  intervention in order to improve on the following deficits Pain;Decreased activity tolerance;Decreased strength;Decreased coordination   Rehab Potential Good   PT Frequency 2x / week   PT Duration 6 weeks   PT Treatment/Interventions Therapeutic exercise;Therapeutic activities;Gait training;Balance training;Neuromuscular re-education;Patient/family education;Electrical Stimulation   PT Next Visit Plan Continue LLE strengthening with focus on L ankle DF per MPT POC. Continue VMS to L anterior tib to improve strength and ROM.   PT Home Exercise Plan HEP- towel L ankle ROM in sitting   Consulted and Agree with Plan of Care Patient        Problem List Patient Active Problem List   Diagnosis Date Noted  . Essential hypertension 03/19/2015  . Primary gout 03/19/2015  . Onychomycosis 03/19/2015  . Lead exposure 03/19/2015  . Severe obesity (BMI >= 40) (HCC) 03/19/2015    Rosella Crandell P ,  PTA  05/03/2015, 9:03 AM  [facility] [address] [place], [place], [address] Phone: [phone]   Fax:  [phone]  Name: BRAN ALDRIDGE MRN: [id number] Date of Birth: 06/03/1965

## 2015-05-03 NOTE — Patient Instructions (Signed)
Dorsiflexion: Isometric    With ball or rolled pillow between feet, squeeze feet together. Hold ___10_ seconds. Relax. Repeat _10___ times per set. Do _1-2___ sets per session. Do __2-3__ sessions per day.  http://orth.exer.us/2   Copyright  VHI. All rights reserved.  Strengthening: Straight Leg Raise (Phase 2)    Resting on forearms, tighten muscles on front of left thigh, then lift leg __5__ inches from surface, keeping knee locked. Repeat _10___ times per set. Do _3___ sets per session. Do __2-3__ sessions per day.  http://orth.exer.us/616   Copyright  VHI. All rights reserved.  Hip Abduction: Modified    Lying on right side with pillow between thighs, raise top leg from pillow, rotating slightly out. Repeat _10___ times per set. Do _2___ sets per session. Do 2-3____ sessions per day.  http://orth.exer.us/704   Copyright  VHI. All rights reserved.

## 2015-05-09 ENCOUNTER — Encounter: Payer: Self-pay | Admitting: Physical Therapy

## 2015-05-09 ENCOUNTER — Ambulatory Visit: Payer: BLUE CROSS/BLUE SHIELD | Attending: Family Medicine | Admitting: Physical Therapy

## 2015-05-09 DIAGNOSIS — R531 Weakness: Secondary | ICD-10-CM | POA: Insufficient documentation

## 2015-05-09 DIAGNOSIS — M259 Joint disorder, unspecified: Secondary | ICD-10-CM | POA: Diagnosis present

## 2015-05-09 DIAGNOSIS — R2681 Unsteadiness on feet: Secondary | ICD-10-CM | POA: Diagnosis present

## 2015-05-09 DIAGNOSIS — R29898 Other symptoms and signs involving the musculoskeletal system: Secondary | ICD-10-CM | POA: Diagnosis present

## 2015-05-09 NOTE — Therapy (Signed)
[facility] [address] [place], [place], [address] Phone: [phone]   Fax:  [phone]  Physical Therapy Treatment  Patient Details  Name: Frank Herrera MRN: [id number] Date of Birth: 11/09/1965 Referring Provider: Mechele Claude MD  Encounter Date: 05/09/2015      PT End of Session - 05/09/15 0847    Visit Number 6   Number of Visits 12   Date for PT Re-Evaluation 06/05/15   PT Start Time 0814   PT Stop Time 0859   PT Time Calculation (min) 45 min   Activity Tolerance Patient tolerated treatment well   Behavior During Therapy [facility] for tasks assessed/performed      Past Medical History  Diagnosis Date  . Hypertension     Past Surgical History  Procedure Laterality Date  . Spine surgery    . Hernia repair      There were no vitals filed for this visit.  Visit Diagnosis:  Weakness of right hip  Weakness  Ankle weakness  Unsteadiness      Subjective Assessment - 05/09/15 0820    Subjective Patient has reported being sore from doing some work, yet no pain today.   Limitations Walking   Patient Stated Goals Want to increase my left leg strength.   Currently in Pain? No/denies                         [facility] Adult PT Treatment/Exercise - 05/09/15 0001    Knee/Hip Exercises: Aerobic   Nustep L6 x15 min, monitored for activity tolerance   Knee/Hip Exercises: Machines for Strengthening   Cybex Knee Extension 20# 3x10   Cybex Knee Flexion 40# 3x10   Knee/Hip Exercises: Standing   Lateral Step Up 10 reps;Hand Hold: 2;Step Height: 6";3 sets;Left   Forward Step Up Left;10 reps;Hand Hold: 2;3 sets;Step Height: 6"   Knee/Hip Exercises: Supine   Bridges Strengthening;Both;2 sets;10 reps  with red swiss for straight leg hip bridge   Electrical Stimulation   Electrical Stimulation Location L Tibialis Anterior   Electrical Stimulation Action VMS   Electrical Stimulation Parameters 10x10 x83min with  orange strap for assist   Electrical Stimulation Goals Strength;Neuromuscular facilitation                     PT Long Term Goals - 05/09/15 0849    PT LONG TERM GOAL #1   Title Eliminate a steppage gait.   Time 6   Period Weeks   Status On-going   PT LONG TERM GOAL #2   Title Right hip and knee strength= 4+/5.   Time 6   Period Weeks   Status On-going   PT LONG TERM GOAL #3   Title Right ankle strength= 4/5 to avoid toe drag during the gait cycle.     Time 6   Period Weeks   Status On-going   PT LONG TERM GOAL #4   Title Eliminate a Trendelenburg gait cycle.   Time 6   Period Weeks   Status On-going               Plan - 05/09/15 0850    Clinical Impression Statement Patient progressing with strengthening exercises and has no pain reports. Patient showing muscle activation in left tib ant with DF using VMS yet needs assistance for full range in supine. Patient has a steppage gait pattern. Unable to meet any further goals due to strength deficit.   Pt will  benefit from skilled therapeutic intervention in order to improve on the following deficits Pain;Decreased activity tolerance;Decreased strength;Decreased coordination   Rehab Potential Good   PT Frequency 2x / week   PT Duration 6 weeks   PT Treatment/Interventions Therapeutic exercise;Therapeutic activities;Gait training;Balance training;Neuromuscular re-education;Patient/family education;Electrical Stimulation   PT Next Visit Plan Continue LLE strengthening with focus on L ankle DF per MPT POC. Continue VMS to L anterior tib to improve strength and ROM.   Consulted and Agree with Plan of Care Patient        Problem List Patient Active Problem List   Diagnosis Date Noted  . Essential hypertension 03/19/2015  . Primary gout 03/19/2015  . Onychomycosis 03/19/2015  . Lead exposure 03/19/2015  . Severe obesity (BMI >= 40) (HCC) 03/19/2015    Alassane Kalafut P, PTA  05/09/2015, 8:59 AM  [facility] [address] [place], [place], [address] Phone: [phone]   Fax:  [phone]  Name: Frank Herrera MRN: [id number] Date of Birth: 08/10/1965

## 2015-05-14 ENCOUNTER — Ambulatory Visit: Payer: BLUE CROSS/BLUE SHIELD | Admitting: Physical Therapy

## 2015-05-14 ENCOUNTER — Encounter: Payer: Self-pay | Admitting: Physical Therapy

## 2015-05-14 DIAGNOSIS — R2681 Unsteadiness on feet: Secondary | ICD-10-CM

## 2015-05-14 DIAGNOSIS — R531 Weakness: Secondary | ICD-10-CM

## 2015-05-14 DIAGNOSIS — R29898 Other symptoms and signs involving the musculoskeletal system: Secondary | ICD-10-CM | POA: Diagnosis not present

## 2015-05-14 NOTE — Therapy (Signed)
[facility] [address] [place], [place], [address] Phone: [phone]   Fax:  [phone]  Physical Therapy Treatment  Patient Details  Name: Frank Herrera MRN: [id number] Date of Birth: 01/23/1966 Referring Provider: Mechele Claude MD  Encounter Date: 05/14/2015      PT End of Session - 05/14/15 0840    Visit Number 7   Number of Visits 12   Date for PT Re-Evaluation 06/05/15   PT Start Time 0814   PT Stop Time 0900   PT Time Calculation (min) 46 min   Activity Tolerance Patient tolerated treatment well   Behavior During Therapy [facility] for tasks assessed/performed      Past Medical History  Diagnosis Date  . Hypertension     Past Surgical History  Procedure Laterality Date  . Spine surgery    . Hernia repair      There were no vitals filed for this visit.  Visit Diagnosis:  Weakness of right hip  Weakness  Ankle weakness  Unsteadiness      Subjective Assessment - 05/14/15 0830    Subjective Patient reported improvement with walking and feeling stronger with less lmping and dragging of LE   Limitations Walking   Patient Stated Goals Want to increase my left leg strength.   Currently in Pain? No/denies                         [facility] Adult PT Treatment/Exercise - 05/14/15 0001    Knee/Hip Exercises: Aerobic   Nustep L6 x15 min, monitored for activity tolerance   Knee/Hip Exercises: Machines for Strengthening   Cybex Knee Extension 20# 3x10   Cybex Knee Flexion 40# 3x10   Knee/Hip Exercises: Standing   Lateral Step Up 10 reps;Hand Hold: 2;Step Height: 6";3 sets;Left   Forward Step Up Left;10 reps;Hand Hold: 2;3 sets;Step Height: 6"   Knee/Hip Exercises: Supine   Bridges Strengthening;Both;2 sets;10 reps  with red swiss for straight leg bridge   Electrical Stimulation   Electrical Stimulation Location L Tibialis Anterior   Electrical Stimulation Action VMS   Electrical Stimulation Parameters  10/10 x14min, with orange strap for assistance DF   Electrical Stimulation Goals Strength;Neuromuscular facilitation                     PT Long Term Goals - 05/09/15 0849    PT LONG TERM GOAL #1   Title Eliminate a steppage gait.   Time 6   Period Weeks   Status On-going   PT LONG TERM GOAL #2   Title Right hip and knee strength= 4+/5.   Time 6   Period Weeks   Status On-going   PT LONG TERM GOAL #3   Title Right ankle strength= 4/5 to avoid toe drag during the gait cycle.     Time 6   Period Weeks   Status On-going   PT LONG TERM GOAL #4   Title Eliminate a Trendelenburg gait cycle.   Time 6   Period Weeks   Status On-going               Plan - 05/14/15 6387    Clinical Impression Statement Patient progressing with LE strengthening exercises with no pain reports and has felt improvement when walking with less "limp and less dragging" per patient. Patient continues to show muscle activation with VMS to tib ant. Unable to meet any further goals due to strngth deficits.   Pt  will benefit from skilled therapeutic intervention in order to improve on the following deficits Pain;Decreased activity tolerance;Decreased strength;Decreased coordination   Rehab Potential Good   PT Frequency 2x / week   PT Duration 6 weeks   PT Treatment/Interventions Therapeutic exercise;Therapeutic activities;Gait training;Balance training;Neuromuscular re-education;Patient/family education;Electrical Stimulation   PT Next Visit Plan Continue LLE strengthening with focus on L ankle DF per MPT POC. Continue VMS to L anterior tib to improve strength and ROM.   Consulted and Agree with Plan of Care Patient        Problem List Patient Active Problem List   Diagnosis Date Noted  . Essential hypertension 03/19/2015  . Primary gout 03/19/2015  . Onychomycosis 03/19/2015  . Lead exposure 03/19/2015  . Severe obesity (BMI >= 40) (HCC) 03/19/2015    Sammye Staff P,  PTA 05/14/2015, 9:22 AM  [facility] [address] [place], [place], [address] Phone: [phone]   Fax:  [phone]  Name: Marrian SalvageSteven D Broers MRN: [id number] Date of Birth: 12/21/1965

## 2015-05-23 ENCOUNTER — Ambulatory Visit: Payer: BLUE CROSS/BLUE SHIELD | Admitting: Physical Therapy

## 2015-05-23 ENCOUNTER — Encounter: Payer: Self-pay | Admitting: Physical Therapy

## 2015-05-23 DIAGNOSIS — R531 Weakness: Secondary | ICD-10-CM

## 2015-05-23 DIAGNOSIS — R29898 Other symptoms and signs involving the musculoskeletal system: Secondary | ICD-10-CM | POA: Diagnosis not present

## 2015-05-23 DIAGNOSIS — R2681 Unsteadiness on feet: Secondary | ICD-10-CM

## 2015-05-23 NOTE — Therapy (Signed)
[facility] [address] [place], [place], [address] Phone: [phone]   Fax:  [phone]  Physical Therapy Treatment  Patient Details  Name: Frank Herrera MRN: [id number] Date of Birth: 01-May-1965 Referring Provider: Mechele Claude MD  Encounter Date: 05/23/2015      PT End of Session - 05/23/15 0858    Visit Number 8   Number of Visits 12   Date for PT Re-Evaluation 06/05/15   PT Start Time 0813   PT Stop Time 0900   PT Time Calculation (min) 47 min   Activity Tolerance Patient tolerated treatment well   Behavior During Therapy [facility] for tasks assessed/performed      Past Medical History  Diagnosis Date  . Hypertension     Past Surgical History  Procedure Laterality Date  . Spine surgery    . Hernia repair      There were no vitals filed for this visit.  Visit Diagnosis:  Weakness of right hip  Weakness  Ankle weakness  Unsteadiness      Subjective Assessment - 05/23/15 0816    Subjective Patient reported progressing and feels better overall.   Limitations Walking   Patient Stated Goals Want to increase my left leg strength.   Currently in Pain? No/denies            [facility] PT Assessment - 05/23/15 0001    Strength   Overall Strength Deficits   Overall Strength Comments DF 2+/5                     [facility] Adult PT Treatment/Exercise - 05/23/15 0001    Knee/Hip Exercises: Aerobic   Nustep L6 x15 min, monitored for activity tolerance   Knee/Hip Exercises: Machines for Strengthening   Cybex Knee Extension 20# 3x10   Cybex Knee Flexion 40# 3x10   Knee/Hip Exercises: Standing   Forward Step Up Left;10 reps;Hand Hold: 2;3 sets;Step Height: 6"   Rocker Board 3 minutes   Other Standing Knee Exercises side to side walk with squat using red t-band 2x10   Knee/Hip Exercises: Supine   Bridges (p) Strengthening;Both;10 reps;3 sets  red swiss ball with straight leg   Electrical Stimulation   Electrical Stimulation Location (p) L Tibialis Anterior   Electrical Stimulation Action (p) VMS   Electrical Stimulation Parameters (p) 10/10 x65min   Electrical Stimulation Goals (p) Strength;Neuromuscular facilitation                     PT Long Term Goals - 05/09/15 0849    PT LONG TERM GOAL #1   Title Eliminate a steppage gait.   Time 6   Period Weeks   Status On-going   PT LONG TERM GOAL #2   Title Right hip and knee strength= 4+/5.   Time 6   Period Weeks   Status On-going   PT LONG TERM GOAL #3   Title Right ankle strength= 4/5 to avoid toe drag during the gait cycle.     Time 6   Period Weeks   Status On-going   PT LONG TERM GOAL #4   Title Eliminate a Trendelenburg gait cycle.   Time 6   Period Weeks   Status On-going               Plan - 05/23/15 1324    Clinical Impression Statement Patient progressing with all activities. Patient has improved DF strength to 2+/5 in left LE today. Patient able to tolerate  all exercises and progressing with no difficulty or pain. Patient unable to meet any further goals due to strength deficits.   Pt will benefit from skilled therapeutic intervention in order to improve on the following deficits Pain;Decreased activity tolerance;Decreased strength;Decreased coordination   Rehab Potential Good   PT Frequency 2x / week   PT Duration 6 weeks   PT Treatment/Interventions Therapeutic exercise;Therapeutic activities;Gait training;Balance training;Neuromuscular re-education;Patient/family education;Electrical Stimulation   PT Next Visit Plan Continue LLE strengthening with focus on L ankle DF per MPT POC. Continue VMS to L anterior tib to improve strength and ROM.   Consulted and Agree with Plan of Care Patient        Problem List Patient Active Problem List   Diagnosis Date Noted  . Essential hypertension 03/19/2015  . Primary gout 03/19/2015  . Onychomycosis 03/19/2015  . Lead exposure 03/19/2015  . Severe  obesity (BMI >= 40) (HCC) 03/19/2015    Wren Pryce P, PTA 05/23/2015, 9:01 AM  [facility] [address] [place], [place], [address] Phone: [phone]   Fax:  [phone]  Name: Frank Herrera MRN: [id number] Date of Birth: 12/19/1965

## 2015-05-28 ENCOUNTER — Ambulatory Visit: Payer: BLUE CROSS/BLUE SHIELD | Admitting: Physical Therapy

## 2015-05-28 ENCOUNTER — Encounter: Payer: Self-pay | Admitting: Physical Therapy

## 2015-05-28 DIAGNOSIS — R29898 Other symptoms and signs involving the musculoskeletal system: Secondary | ICD-10-CM | POA: Diagnosis not present

## 2015-05-28 DIAGNOSIS — R531 Weakness: Secondary | ICD-10-CM

## 2015-05-28 DIAGNOSIS — R2681 Unsteadiness on feet: Secondary | ICD-10-CM

## 2015-05-28 NOTE — Addendum Note (Signed)
Addended by: Paulino Cork, [place] W on: 05/28/2015 05:17 PM   Modules accepted: Orders

## 2015-05-28 NOTE — Therapy (Signed)
[facility] [address] [place], [place], [address] Phone: [phone]   Fax:  [phone]  Physical Therapy Treatment  Patient Details  Name: Frank Herrera MRN: [id number] Date of Birth: 04/17/1965 Referring Provider: Mechele ClaudeWarren Stacks MD  Encounter Date: 05/28/2015      PT End of Session - 05/28/15 0848    Visit Number 9   Date for PT Re-Evaluation 06/05/15   PT Start Time 0815   PT Stop Time 0859   PT Time Calculation (min) 44 min   Activity Tolerance Patient tolerated treatment well   Behavior During Therapy [facility] for tasks assessed/performed      Past Medical History  Diagnosis Date  . Hypertension     Past Surgical History  Procedure Laterality Date  . Spine surgery    . Hernia repair      There were no vitals filed for this visit.  Visit Diagnosis:  Weakness of right hip  Weakness  Ankle weakness  Unsteadiness      Subjective Assessment - 05/28/15 0818    Subjective Patient reported progressing and feels better overall.   Limitations Walking   Patient Stated Goals Want to increase my left leg strength.   Currently in Pain? No/denies                         [facility] Treatment/Exercise - 05/28/15 0001    Knee/Hip Exercises: Aerobic   Nustep L6 x15 min, monitored for activity tolerance   Knee/Hip Exercises: Machines for Strengthening   Cybex Knee Extension 20# 3x10   Cybex Knee Flexion 40# 3x10   Knee/Hip Exercises: Standing   Other Standing Knee Exercises side to side walk with squat using red t-band 2x10   Knee/Hip Exercises: Supine   Bridges Strengthening;Both;10 reps;3 sets   Knee/Hip Exercises: Sidelying   Hip ABduction Strengthening;Left;2 sets;10 reps   Electrical Stimulation   Electrical Stimulation Location L Tibialis Anterior   Electrical Stimulation Action VMS   Electrical Stimulation Parameters 10/10 x9210min   Electrical Stimulation Goals Strength;Neuromuscular  facilitation                     PT Long Term Goals - 05/28/15 0839    PT LONG TERM GOAL #1   Title Eliminate a steppage gait.   Time 6   Period Weeks   Status On-going   PT LONG TERM GOAL #2   Title Right hip and knee strength= 4+/5.   Time 6   Period Weeks   Status Achieved  05/28/15 Right hip, quad, HS 4+/5   PT LONG TERM GOAL #3   Title Right ankle strength= 4/5 to avoid toe drag during the gait cycle.     Time 6   Period Weeks   Status On-going   PT LONG TERM GOAL #4   Title Eliminate a Trendelenburg gait cycle.   Time 6   Period Weeks   Status On-going               Plan - 05/28/15 0849    Clinical Impression Statement Patient progresing with improved strength in left hip and knee strength and was able to meet LTG #4 today. Patient unable to meet other goals due to weakness for DF ankle strength. Patient unable to perform antigavity DF in bil ankles today. Patient is to make follow up with MD.  Patient has steppage gait with ambulation.   Pt will benefit from skilled therapeutic  intervention in order to improve on the following deficits Pain;Decreased activity tolerance;Decreased strength;Decreased coordination   Rehab Potential Good   PT Frequency 2x / week   PT Duration 6 weeks   PT Treatment/Interventions Therapeutic exercise;Therapeutic activities;Gait training;Balance training;Neuromuscular re-education;Patient/family education;Electrical Stimulation   PT Next Visit Plan Continue LLE strengthening with focus on L ankle DF per MPT POC. Continue VMS to L anterior tib to improve strength and ROM.   Consulted and Agree with Plan of Care Patient        Problem List Patient Active Problem List   Diagnosis Date Noted  . Essential hypertension 03/19/2015  . Primary gout 03/19/2015  . Onychomycosis 03/19/2015  . Lead exposure 03/19/2015  . Severe obesity (BMI >= 40) (HCC) 03/19/2015    Pressley Tadesse P, PTA 05/28/2015, 9:00 AM  [facility] [address] [place], [place], [address] Phone: [phone]   Fax:  [phone]  Name: Frank Herrera MRN: [id number] Date of Birth: [birth year]

## 2015-06-06 ENCOUNTER — Ambulatory Visit: Payer: BLUE CROSS/BLUE SHIELD | Admitting: Physical Therapy

## 2015-06-06 ENCOUNTER — Encounter: Payer: Self-pay | Admitting: Physical Therapy

## 2015-06-06 DIAGNOSIS — R531 Weakness: Secondary | ICD-10-CM

## 2015-06-06 DIAGNOSIS — R2681 Unsteadiness on feet: Secondary | ICD-10-CM

## 2015-06-06 DIAGNOSIS — R29898 Other symptoms and signs involving the musculoskeletal system: Secondary | ICD-10-CM | POA: Diagnosis not present

## 2015-06-06 NOTE — Therapy (Signed)
[facility] [place], [place], [address] Phone: [phone]   Fax:  [phone]  Physical Therapy Treatment  Patient Details  Name: Frank Herrera MRN: [id number] Date of Birth: 1965-03-13 Referring Provider: [name] MD  Encounter Date: 06/06/2015      PT End of Session - 06/06/15 0835    Visit Number 10   Number of Visits 12   PT Start Time 0814   PT Stop Time 0858   PT Time Calculation (min) 44 min   Activity Tolerance Patient tolerated treatment well   Behavior During Therapy [facility] for tasks assessed/performed      Past Medical History  Diagnosis Date  . Hypertension     Past Surgical History  Procedure Laterality Date  . Spine surgery    . Hernia repair      There were no vitals filed for this visit.  Visit Diagnosis:  Weakness of right hip  Weakness  Ankle weakness  Unsteadiness      Subjective Assessment - 06/06/15 3491    Subjective Patient feels better overall yet has difficulty walking due to bil feet seem to drag   Limitations Walking   Patient Stated Goals Want to increase my left leg strength.   Currently in Pain? No/denies                         [facility] Adult PT Treatment/Exercise - 06/06/15 0001    Knee/Hip Exercises: Aerobic   Nustep L6 x15 min, monitored for activity tolerance   Knee/Hip Exercises: Machines for Strengthening   Cybex Knee Extension 20# 3x10   Cybex Knee Flexion 40# 3x10   Knee/Hip Exercises: Standing   Rocker Board 3 minutes   Knee/Hip Exercises: Supine   Bridges Strengthening;Both;10 reps;3 sets  red swiss ball straight leg   Knee/Hip Exercises: Sidelying   Hip ABduction Strengthening;Left;2 sets;10 reps   Electrical Stimulation   Electrical Stimulation Location L Tibialis Anterior   Electrical Stimulation Action VMS   Electrical Stimulation Parameters 10/10 x 43mn   Electrical Stimulation Goals Strength;Neuromuscular facilitation                      PT Long Term Goals - 06/06/15 0840    PT LONG TERM GOAL #1   Title Eliminate a steppage gait.   Time 6   Period Weeks   Status Not Met   PT LONG TERM GOAL #2   Title Right hip and knee strength= 4+/5.   Time 6   Period Weeks   Status Achieved   PT LONG TERM GOAL #3   Title Right ankle strength= 4/5 to avoid toe drag during the gait cycle.     Time 6   Period Weeks   Status Not Met  -2/5 06/06/15   PT LONG TERM GOAL #4   Title Eliminate a Trendelenburg gait cycle.   Time 6   Period Weeks   Status Achieved               Plan - 06/06/15 0846    Clinical Impression Statement Patient has improved left knee and hip strength and does not amulate with trendelengberg gait any longer. Patient has difficulty with not only left ankle but right also with weakness and steppage gait pattern. patient is unable to progress with goals due to limitations in bil ankle. strength -2/5 at best today in bil ankle strength. Patient would like to return to doctor  for possible MRI or other specialist to determine deficits.  Patient met two goals and other goals not met due to strength deficit in anlke DF.   Pt will benefit from skilled therapeutic intervention in order to improve on the following deficits Pain;Decreased activity tolerance;Decreased strength;Decreased coordination   Rehab Potential Good   PT Frequency 2x / week   PT Duration 6 weeks   PT Treatment/Interventions Therapeutic exercise;Therapeutic activities;Gait training;Balance training;Neuromuscular re-education;Patient/family education;Electrical Stimulation   PT Next Visit Plan on hold per MPT to see MD   Consulted and Agree with Plan of Care Patient        Problem List Patient Active Problem List   Diagnosis Date Noted  . Essential hypertension 03/19/2015  . Primary gout 03/19/2015  . Onychomycosis 03/19/2015  . Lead exposure 03/19/2015  . Severe obesity (BMI >= 40) (HCC) 03/19/2015     Frank Herrera P, PTA 06/06/2015, 8:58 AM   Frank Herrera, PTA 06/06/2015 8:58 AM   [facility] [place], [place], [address] Phone: [phone]   Fax:  [phone]  Name: Frank Herrera MRN: [id number] Date of Birth: 1966-03-09

## 2015-06-11 ENCOUNTER — Encounter (INDEPENDENT_AMBULATORY_CARE_PROVIDER_SITE_OTHER): Payer: Self-pay

## 2015-06-11 ENCOUNTER — Ambulatory Visit (INDEPENDENT_AMBULATORY_CARE_PROVIDER_SITE_OTHER): Payer: BLUE CROSS/BLUE SHIELD | Admitting: Family Medicine

## 2015-06-11 ENCOUNTER — Encounter: Payer: Self-pay | Admitting: Physical Therapy

## 2015-06-11 ENCOUNTER — Encounter: Payer: Self-pay | Admitting: *Deleted

## 2015-06-11 ENCOUNTER — Ambulatory Visit: Payer: BLUE CROSS/BLUE SHIELD | Attending: Family Medicine | Admitting: Physical Therapy

## 2015-06-11 VITALS — BP 141/88 | HR 72 | Temp 97.8°F | Ht 71.0 in | Wt 274.3 lb

## 2015-06-11 DIAGNOSIS — M25552 Pain in left hip: Secondary | ICD-10-CM

## 2015-06-11 DIAGNOSIS — R2681 Unsteadiness on feet: Secondary | ICD-10-CM | POA: Diagnosis present

## 2015-06-11 DIAGNOSIS — R531 Weakness: Secondary | ICD-10-CM | POA: Diagnosis present

## 2015-06-11 DIAGNOSIS — M259 Joint disorder, unspecified: Secondary | ICD-10-CM | POA: Diagnosis present

## 2015-06-11 DIAGNOSIS — R29898 Other symptoms and signs involving the musculoskeletal system: Secondary | ICD-10-CM

## 2015-06-11 NOTE — Therapy (Signed)
[facility] [place], [place], [address] Phone: [phone]   Fax:  [phone]  Physical Therapy Treatment  Patient Details  Name: Frank Herrera MRN: [id number] Date of Birth: 12-03-1965 Referring Provider: [name] MD  Encounter Date: 06/11/2015      PT End of Session - 06/11/15 0839    Visit Number 11   Number of Visits 12   Date for PT Re-Evaluation 06/05/15   PT Start Time 0815   PT Stop Time 1324   PT Time Calculation (min) 42 min   Activity Tolerance Patient tolerated treatment well   Behavior During Therapy [facility] for tasks assessed/performed      Past Medical History  Diagnosis Date  . Hypertension     Past Surgical History  Procedure Laterality Date  . Spine surgery    . Hernia repair      There were no vitals filed for this visit.  Visit Diagnosis:  Weakness of right hip  Weakness  Ankle weakness  Unsteadiness      Subjective Assessment - 06/11/15 0817    Subjective Patient feels better overall yet has difficulty walking due to bil feet seem to drag   Limitations Walking   Patient Stated Goals Want to increase my left leg strength.   Currently in Pain? No/denies                         [facility] Adult PT Treatment/Exercise - 06/11/15 0001    Knee/Hip Exercises: Aerobic   Nustep L6 x15 min, monitored for activity tolerance   Knee/Hip Exercises: Machines for Strengthening   Cybex Knee Extension 20# 3x10   Cybex Knee Flexion 40# 3x10   Knee/Hip Exercises: Standing   Rocker Board 3 minutes   Knee/Hip Exercises: Supine   Bridges Strengthening;Both;10 reps;3 sets  red swiss ball straight leg   Knee/Hip Exercises: Sidelying   Hip ABduction Strengthening;Left;2 sets;10 reps   Electrical Stimulation   Electrical Stimulation Location L Tibialis Anterior   Electrical Stimulation Action VMS   Electrical Stimulation Parameters 10/10 x39mn   Electrical Stimulation Goals  Strength;Neuromuscular facilitation                     PT Long Term Goals - 06/06/15 0840    PT LONG TERM GOAL #1   Title Eliminate a steppage gait.   Time 6   Period Weeks   Status Not Met   PT LONG TERM GOAL #2   Title Right hip and knee strength= 4+/5.   Time 6   Period Weeks   Status Achieved   PT LONG TERM GOAL #3   Title Right ankle strength= 4/5 to avoid toe drag during the gait cycle.     Time 6   Period Weeks   Status Not Met  -2/5 06/06/15   PT LONG TERM GOAL #4   Title Eliminate a Trendelenburg gait cycle.   Time 6   Period Weeks   Status Achieved               Plan - 06/11/15 0841    Clinical Impression Statement Patient has improved with both hip and knee strength and has eliminated the trendelengberg gait. Patient continues to have weakness and difficulty with not only left but right ankle also with bilateral steppage gait. Patient has limitations with bil DF to -2/5 DF strength. Patient would like to return to MD for possible MRI of back or  be referred to a specialist to determine deficits.    Pt will benefit from skilled therapeutic intervention in order to improve on the following deficits Pain;Decreased activity tolerance;Decreased strength;Decreased coordination   PT Frequency 2x / week   PT Duration 6 weeks   PT Treatment/Interventions Therapeutic exercise;Therapeutic activities;Gait training;Balance training;Neuromuscular re-education;Patient/family education;Electrical Stimulation   PT Next Visit Plan on hold per MPT to see MD   Consulted and Agree with Plan of Care Patient        Problem List Patient Active Problem List   Diagnosis Date Noted  . Essential hypertension 03/19/2015  . Primary gout 03/19/2015  . Onychomycosis 03/19/2015  . Lead exposure 03/19/2015  . Severe obesity (BMI >= 40) (HCC) 03/19/2015    Lorenz Donley P, PTA 06/11/2015, 8:57 AM  [facility] [place], [place], [address] Phone: [phone]   Fax:  [phone]  Name: Frank Herrera MRN: [id number] Date of Birth: 1965/07/13

## 2015-06-11 NOTE — Progress Notes (Signed)
Subjective:  Patient ID: Frank Herrera, male    DOB: 02/17/1966  Age: 50 y.o. MRN: [id number]  CC: Extremity Weakness   HPI Frank SalvageSteven D Herrera presents for  Recheck of left hip pain.  History Frank Herrera has a past medical history of Hypertension.   He has past surgical history that includes Spine surgery and Hernia repair.   His family history is not on file.He reports that he has never smoked. He does not have any smokeless tobacco history on file. He reports that he drinks alcohol. He reports that he does not use illicit drugs.    ROS Review of Systems  Constitutional: Positive for activity change. Negative for fever and appetite change.  Musculoskeletal: Positive for arthralgias.    Objective:  BP 141/88 mmHg  Pulse 72  Temp(Src) 97.8 F (36.6 C) (Oral)  Ht 5\' 11"  (1.803 m)  Wt 274 lb 4.8 oz (124.422 kg)  BMI 38.27 kg/m2  SpO2 100%  BP Readings from Last 3 Encounters:  06/11/15 141/88  04/03/15 128/87  03/19/15 134/85    Wt Readings from Last 3 Encounters:  06/11/15 274 lb 4.8 oz (124.422 kg)  04/03/15 279 lb 12.8 oz (126.916 kg)  03/19/15 283 lb 3.2 oz (128.459 kg)     Physical Exam  Constitutional: He is oriented to person, place, and time. He appears well-developed and well-nourished.  HENT:  Head: Normocephalic and atraumatic.  Neck: Normal range of motion. Neck supple.  Musculoskeletal: He exhibits tenderness (left hip).  Neurological: He is alert and oriented to person, place, and time.  Vitals reviewed.    Lab Results  Component Value Date   WBC 4.1 03/19/2015   HGB 13.0* 06/23/2014   HCT 38.2 03/19/2015   PLT 282 03/19/2015   GLUCOSE 95 03/19/2015   CHOL 188 03/19/2015   TRIG 94 03/19/2015   HDL 51 03/19/2015   LDLCALC 118* 03/19/2015   ALT 28 03/19/2015   AST 25 03/19/2015   NA 140 03/19/2015   K 4.1 03/19/2015   CL 98 03/19/2015   CREATININE 1.04 03/19/2015   BUN 12 03/19/2015   CO2 25 03/19/2015   TSH 2.170 12/22/2012   PSA 0.2  12/22/2012   INR 0.9 12/07/2008    Dg Chest 2 View  12/07/2008  Clinical Data: Lumbar HNP, preop.  Cough, congestion.  CHEST - 2 VIEW  Comparison: None  Findings: There are low lung volumes.  Heart is mildly enlarged. No confluent opacities, effusions or edema.  No acute bony abnormality.  IMPRESSION: Low lung volumes.  Mild cardiomegaly. Provider: Sherol DadeMatthew Wiersma  Dg Lumbar Spine 2-3 Views  12/07/2008  Clinical Data: History given of lumbar herniated disc.  Left L4-L5 far left discectomy.  LUMBAR SPINE - 2-3 VIEW  Comparison: None  Findings: Portable lateral localization images were obtained.  Image obtained at 1100 hours shows the tip of a metallic instrument projecting over the spinous process of L4.  Portable cross-table examination at 1130 hours shows metallic instrument projecting at the L4-L5 level.  IMPRESSION: Localization images were obtained. Provider: Rachael DarbyJerry Bowen, Wendi MayaLauren Greeson   Assessment & Plan:   Frank Herrera was seen today for extremity weakness.  Diagnoses and all orders for this visit:  Pain in left hip    MRI left hip ordered  I am having Frank Herrera maintain his hyoscyamine, hydrochlorothiazide, ciclopirox, diclofenac, allopurinol, amLODipine, and metoprolol tartrate.  No orders of the defined types were placed in this encounter.     Follow-up: Return if symptoms worsen  or fail to improve.  [name], M.D.

## 2015-06-21 NOTE — Therapy (Signed)
[facility] [place], [place], [address] Phone: [phone]   Fax:  [phone]  Physical Therapy Treatment  Patient Details  Name: Frank Herrera MRN: [id number] Date of Birth: 09-28-1965 Referring Provider: [name] MD  Encounter Date: 06/11/2015    Past Medical History  Diagnosis Date  . Hypertension     Past Surgical History  Procedure Laterality Date  . Spine surgery    . Hernia repair      There were no vitals filed for this visit.                                    PT Long Term Goals - 06/06/15 0840    PT LONG TERM GOAL #1   Title Eliminate a steppage gait.   Time 6   Period Weeks   Status Not Met   PT LONG TERM GOAL #2   Title Right hip and knee strength= 4+/5.   Time 6   Period Weeks   Status Achieved   PT LONG TERM GOAL #3   Title Right ankle strength= 4/5 to avoid toe drag during the gait cycle.     Time 6   Period Weeks   Status Not Met  -2/5 06/06/15   PT LONG TERM GOAL #4   Title Eliminate a Trendelenburg gait cycle.   Time 6   Period Weeks   Status Achieved             Patient will benefit from skilled therapeutic intervention in order to improve the following deficits and impairments:  Pain, Decreased activity tolerance, Decreased strength, Decreased coordination  Visit Diagnosis: Weakness of right hip  Weakness  Ankle weakness  Unsteadiness     Problem List Patient Active Problem List   Diagnosis Date Noted  . Essential hypertension 03/19/2015  . Primary gout 03/19/2015  . Onychomycosis 03/19/2015  . Lead exposure 03/19/2015  . Severe obesity (BMI >= 40) (HCC) 03/19/2015   PHYSICAL THERAPY DISCHARGE SUMMARY  Visits from Start of Care:   Current functional level related to goals / functional outcomes: Please see above.   Remaining deficits: Continued ankle weakness.   Education / Equipment: HEP.  Plan: Patient agrees  to discharge.  Patient goals were partially met. Patient is being discharged due to meeting the stated rehab goals.  ?????      Shyheim Tanney, [place]  MPT  06/21/2015, 1:50 PM  [facility] [address] [place], [place], [address] Phone: [phone]   Fax:  [phone]  Name: Frank Herrera MRN: [id number] Date of Birth: 02/08/1966

## 2015-09-12 DIAGNOSIS — R42 Dizziness and giddiness: Secondary | ICD-10-CM | POA: Insufficient documentation

## 2015-09-12 DIAGNOSIS — R748 Abnormal levels of other serum enzymes: Secondary | ICD-10-CM | POA: Insufficient documentation

## 2015-09-12 DIAGNOSIS — E876 Hypokalemia: Secondary | ICD-10-CM | POA: Insufficient documentation

## 2015-09-18 ENCOUNTER — Ambulatory Visit (INDEPENDENT_AMBULATORY_CARE_PROVIDER_SITE_OTHER): Payer: BLUE CROSS/BLUE SHIELD | Admitting: Family Medicine

## 2015-09-18 ENCOUNTER — Encounter: Payer: Self-pay | Admitting: Family Medicine

## 2015-09-18 VITALS — BP 122/82 | HR 78 | Temp 98.1°F | Ht 71.0 in | Wt 268.6 lb

## 2015-09-18 DIAGNOSIS — I10 Essential (primary) hypertension: Secondary | ICD-10-CM

## 2015-09-18 DIAGNOSIS — M1000 Idiopathic gout, unspecified site: Secondary | ICD-10-CM

## 2015-09-18 DIAGNOSIS — R42 Dizziness and giddiness: Secondary | ICD-10-CM | POA: Diagnosis not present

## 2015-09-18 NOTE — Progress Notes (Signed)
Subjective:  Patient ID: Frank Herrera, male    DOB: [birth year]  Age: 50 y.o. MRN: [id number]  CC: Hypertension and Gout   HPI Frank Herrera presents for  follow-up of hypertension. Patient has no history of headache   or recent cough. Patient also denies symptoms of TIA such as numbness weakness lateralizing. Patient checks  blood pressure at home and has not had any elevated readings recently. Patient denies side effects from medication. Erections are just "so-so." States taking meds regularly.  Chart review via care everywhere confirms pt history. Echo showed LVEF of 50-55%with mild concentric LVH. Cardiac enzymes showed CK 750 with MB 10.45. Troponin was elevated at 0.020. Cardiomegaly noted on CXR Potassium low at 3.2.  Patient continues to take allopurinol for his gout. He has not had an attack in over a year.   History Wyeth has a past medical history of Hypertension.   He has past surgical history that includes Spine surgery and Hernia repair.   His family history includes Heart disease in his father; Hypertension in his brother, father, mother, and sister; Kidney disease in his father.He reports that he has never smoked. He does not have any smokeless tobacco history on file. He reports that he drinks alcohol. He reports that he does not use illicit drugs.  Current Outpatient Prescriptions on File Prior to Visit  Medication Sig Dispense Refill  . allopurinol (ZYLOPRIM) 100 MG tablet TAKE 2 TABLETS (200 MG TOTAL) BY MOUTH DAILY. 180 tablet 0  . amLODipine (NORVASC) 10 MG tablet TAKE 1 TABLET (10 MG TOTAL) BY MOUTH DAILY. 90 tablet 1  . ciclopirox (PENLAC) 8 % solution Apply topically at bedtime. Apply over nail and surrounding skin. Apply daily over previous coat. After seven (7) days, may remove with alcohol and continue cycle. 6.6 mL 0  . diclofenac (VOLTAREN) 75 MG EC tablet Take 1 tablet (75 mg total) by mouth 2 (two) times daily. 60 tablet 2  . hydrochlorothiazide  (HYDRODIURIL) 25 MG tablet TAKE 1 TABLET (25 MG TOTAL) BY MOUTH DAILY. 90 tablet 3  . hyoscyamine (LEVSIN/SL) 0.125 MG SL tablet Place 1 tablet (0.125 mg total) under the tongue every 4 (four) hours as needed. 30 tablet 0  . metoprolol tartrate (LOPRESSOR) 25 MG tablet Take 1 tablet (25 mg total) by mouth 2 (two) times daily. 180 tablet 1   No current facility-administered medications on file prior to visit.    ROS Review of Systems  Constitutional: Negative for fever, chills, diaphoresis and unexpected weight change.  HENT: Negative for congestion, hearing loss, rhinorrhea and sore throat.   Eyes: Negative for visual disturbance.  Respiratory: Negative for cough and shortness of breath.   Cardiovascular: Negative for chest pain.  Gastrointestinal: Negative for abdominal pain, diarrhea and constipation.  Genitourinary: Negative for dysuria and flank pain.  Musculoskeletal: Positive for arthralgias. Negative for joint swelling.  Skin: Negative for rash.  Neurological: Positive for dizziness. Negative for headaches.  Psychiatric/Behavioral: Negative for sleep disturbance and dysphoric mood.    Objective:  BP 122/82 mmHg  Pulse 78  Temp(Src) 98.1 F (36.7 C) (Oral)  Ht '5\' 11"'$  (1.803 m)  Wt 268 lb 9.6 oz (121.836 kg)  BMI 37.48 kg/m2  SpO2 97%  BP Readings from Last 3 Encounters:  09/18/15 122/82  06/11/15 141/88  04/03/15 128/87    Wt Readings from Last 3 Encounters:  09/18/15 268 lb 9.6 oz (121.836 kg)  06/11/15 274 lb 4.8 oz (124.422 kg)  04/03/15 279 lb  12.8 oz (126.916 kg)     Physical Exam  Constitutional: He is oriented to person, place, and time. He appears well-developed and well-nourished. No distress.  HENT:  Head: Normocephalic and atraumatic.  Right Ear: External ear normal.  Left Ear: External ear normal.  Nose: Nose normal.  Mouth/Throat: Oropharynx is clear and moist.  Eyes: Conjunctivae and EOM are normal. Pupils are equal, round, and reactive to  light.  Neck: Normal range of motion. Neck supple. No thyromegaly present.  Cardiovascular: Normal rate, regular rhythm and normal heart sounds.   No murmur heard. Pulmonary/Chest: Effort normal and breath sounds normal. No respiratory distress. He has no wheezes. He has no rales.  Abdominal: Soft. Bowel sounds are normal. He exhibits no distension. There is no tenderness.  Lymphadenopathy:    He has no cervical adenopathy.  Neurological: He is alert and oriented to person, place, and time. He has normal reflexes.  Skin: Skin is warm and dry.  Psychiatric: He has a normal mood and affect. His behavior is normal. Judgment and thought content normal.     Lab Results  Component Value Date   WBC 5.5 09/18/2015   HGB 13.0* 06/23/2014   HCT 40.0 09/18/2015   PLT 262 09/18/2015   GLUCOSE 102* 09/18/2015   CHOL 198 09/18/2015   TRIG 117 09/18/2015   HDL 63 09/18/2015   LDLCALC 112* 09/18/2015   ALT 33 09/18/2015   AST 28 09/18/2015   NA 138 09/18/2015   K 3.9 09/18/2015   CL 96 09/18/2015   CREATININE 1.13 09/18/2015   BUN 21 09/18/2015   CO2 23 09/18/2015   TSH 2.170 12/22/2012   PSA 0.2 12/22/2012   INR 0.9 12/07/2008     Assessment & Plan:   Frank Herrera was seen today for hypertension and gout.  Diagnoses and all orders for this visit:  Essential hypertension -     CMP14+EGFR -     CBC with Differential/Platelet  Idiopathic gout, unspecified chronicity, unspecified site  Morbid obesity due to excess calories (HCC) -     Lipid panel  Dizziness -     EKG 12-Lead   I am having Frank Herrera maintain his hyoscyamine, hydrochlorothiazide, ciclopirox, diclofenac, allopurinol, amLODipine, and metoprolol tartrate.  No orders of the defined types were placed in this encounter.    No ischemic changes on EKG. NSR, nml intervals.  Follow-up: Return in about 6 months (around 03/20/2016), or if symptoms worsen or fail to improve, for hypertension.  [name], M.D.

## 2015-09-19 ENCOUNTER — Other Ambulatory Visit: Payer: Self-pay | Admitting: Family Medicine

## 2015-09-19 DIAGNOSIS — R748 Abnormal levels of other serum enzymes: Secondary | ICD-10-CM

## 2015-09-19 DIAGNOSIS — I517 Cardiomegaly: Secondary | ICD-10-CM

## 2015-09-19 LAB — CMP14+EGFR
ALT: 33 IU/L (ref 0–44)
AST: 28 IU/L (ref 0–40)
Albumin/Globulin Ratio: 1.6 (ref 1.2–2.2)
Albumin: 4.7 g/dL (ref 3.5–5.5)
Alkaline Phosphatase: 97 IU/L (ref 39–117)
BUN/Creatinine Ratio: 19 (ref 9–20)
BUN: 21 mg/dL (ref 6–24)
Bilirubin Total: 0.4 mg/dL (ref 0.0–1.2)
CO2: 23 mmol/L (ref 18–29)
Calcium: 9.8 mg/dL (ref 8.7–10.2)
Chloride: 96 mmol/L (ref 96–106)
Creatinine, Ser: 1.13 mg/dL (ref 0.76–1.27)
GFR calc Af Amer: 88 mL/min/{1.73_m2} (ref >59)
GFR calc non Af Amer: 76 mL/min/{1.73_m2} (ref >59)
Globulin, Total: 2.9 g/dL (ref 1.5–4.5)
Glucose: 102 mg/dL — ABNORMAL HIGH (ref 65–99)
Potassium: 3.9 mmol/L (ref 3.5–5.2)
Sodium: 138 mmol/L (ref 134–144)
Total Protein: 7.6 g/dL (ref 6.0–8.5)

## 2015-09-19 LAB — CBC WITH DIFFERENTIAL/PLATELET
Basophils Absolute: 0 10*3/uL (ref 0.0–0.2)
Basos: 1 %
EOS (ABSOLUTE): 0.2 10*3/uL (ref 0.0–0.4)
Eos: 3 %
Hematocrit: 40 % (ref 37.5–51.0)
Hemoglobin: 13.3 g/dL (ref 12.6–17.7)
Immature Grans (Abs): 0 10*3/uL (ref 0.0–0.1)
Immature Granulocytes: 0 %
Lymphocytes Absolute: 1.9 10*3/uL (ref 0.7–3.1)
Lymphs: 35 %
MCH: 29.6 pg (ref 26.6–33.0)
MCHC: 33.3 g/dL (ref 31.5–35.7)
MCV: 89 fL (ref 79–97)
Monocytes Absolute: 0.5 10*3/uL (ref 0.1–0.9)
Monocytes: 10 %
Neutrophils Absolute: 2.8 10*3/uL (ref 1.4–7.0)
Neutrophils: 51 %
Platelets: 262 10*3/uL (ref 150–379)
RBC: 4.5 x10E6/uL (ref 4.14–5.80)
RDW: 13.8 % (ref 12.3–15.4)
WBC: 5.5 10*3/uL (ref 3.4–10.8)

## 2015-09-19 LAB — LIPID PANEL
Chol/HDL Ratio: 3.1 ratio units (ref 0.0–5.0)
Cholesterol, Total: 198 mg/dL (ref 100–199)
HDL: 63 mg/dL (ref >39)
LDL Calculated: 112 mg/dL — ABNORMAL HIGH (ref 0–99)
Triglycerides: 117 mg/dL (ref 0–149)
VLDL Cholesterol Cal: 23 mg/dL (ref 5–40)

## 2015-09-21 ENCOUNTER — Telehealth: Payer: Self-pay | Admitting: *Deleted

## 2015-09-21 NOTE — Telephone Encounter (Signed)
Pt notified of results

## 2015-09-21 NOTE — Telephone Encounter (Signed)
-----   Message from Mechele ClaudeWarren Stacks, MD sent at 09/19/2015  9:22 AM EDT ----- Labs look good, but pt. Needs stress echo due to recent hospital stay for dizziness.

## 2015-10-05 ENCOUNTER — Telehealth (HOSPITAL_COMMUNITY): Payer: Self-pay | Admitting: *Deleted

## 2015-10-05 NOTE — Telephone Encounter (Signed)
Left message on voicemail per DPR in reference to upcoming appointment scheduled on 10/11/15 at 7:30 with detailed instructions given per Stress Test Requisition Sheet for the test. LM to arrive 30 minutes early, and that it is imperative to arrive on time for appointment to keep from having the test rescheduled. If you need to cancel or reschedule your appointment, please call the office within 24 hours of your appointment. Failure to do so may result in a cancellation of your appointment, and a $50 no show fee. Phone number given for call back for any questions. Frank Herrera

## 2015-10-11 ENCOUNTER — Ambulatory Visit (HOSPITAL_COMMUNITY): Payer: BLUE CROSS/BLUE SHIELD | Attending: Cardiology

## 2015-10-11 ENCOUNTER — Other Ambulatory Visit: Payer: Self-pay | Admitting: Family Medicine

## 2015-10-11 DIAGNOSIS — R748 Abnormal levels of other serum enzymes: Secondary | ICD-10-CM | POA: Diagnosis not present

## 2015-10-11 DIAGNOSIS — I119 Hypertensive heart disease without heart failure: Secondary | ICD-10-CM | POA: Diagnosis not present

## 2015-10-11 DIAGNOSIS — I517 Cardiomegaly: Secondary | ICD-10-CM | POA: Diagnosis present

## 2015-10-11 MED ORDER — DOBUTAMINE INFUSION FOR EP/ECHO/NUC (1000 MCG/ML)
40.0000 ug/kg/min | Freq: Once | INTRAVENOUS | Status: AC
  Administered 2015-10-11: 40 ug/kg/min via INTRAVENOUS

## 2015-10-11 MED ORDER — ATROPINE SULFATE 1 MG/10ML IJ SOSY
0.7500 mg | PREFILLED_SYRINGE | Freq: Once | INTRAMUSCULAR | Status: AC
  Administered 2015-10-11: 0.75 mg via INTRAVENOUS

## 2015-10-22 ENCOUNTER — Other Ambulatory Visit: Payer: Self-pay | Admitting: Family Medicine

## 2015-11-14 ENCOUNTER — Other Ambulatory Visit: Payer: Self-pay | Admitting: Family Medicine

## 2016-02-04 ENCOUNTER — Encounter: Payer: Self-pay | Admitting: Family Medicine

## 2016-02-04 ENCOUNTER — Ambulatory Visit (INDEPENDENT_AMBULATORY_CARE_PROVIDER_SITE_OTHER): Payer: BLUE CROSS/BLUE SHIELD | Admitting: Family Medicine

## 2016-02-04 DIAGNOSIS — M79671 Pain in right foot: Secondary | ICD-10-CM | POA: Diagnosis not present

## 2016-02-04 DIAGNOSIS — Z23 Encounter for immunization: Secondary | ICD-10-CM

## 2016-02-04 DIAGNOSIS — G8929 Other chronic pain: Secondary | ICD-10-CM

## 2016-02-04 DIAGNOSIS — M21371 Foot drop, right foot: Secondary | ICD-10-CM | POA: Diagnosis not present

## 2016-02-04 NOTE — Progress Notes (Signed)
Subjective:  Patient ID: Frank Herrera, male    DOB: 10/31/1965  Age: 50 y.o. MRN: [id number]  CC: Foot Pain (pt here today c/o right foot pain with numbness in the right big toe)   HPI Frank SalvageSteven D Herrera presents for Ongoing pain at the heel of the right foot plantar surface. This is been present for several months. Physical therapy helped for a while. However it has started getting worse. He had an injection by podiatry, Dr. Ulice Brilliantrake, 2 weeks ago that did nothing for it. There is no redness or swelling in the toe but it is painful and numb. Points to the lateral aspect of the toe opposite the nail.   History Frank Herrera has a past medical history of Hypertension.   He has a past surgical history that includes Spine surgery and Hernia repair.   His family history includes Heart disease in his father; Hypertension in his brother, father, mother, and sister; Kidney disease in his father.He reports that he has never smoked. He has never used smokeless tobacco. He reports that he drinks alcohol. He reports that he does not use drugs.    ROS Review of Systems negative except as in history of present illness  Objective:  BP 132/75   Pulse 70   Temp 97.7 F (36.5 C) (Oral)   Ht 5\' 11"  (1.803 m)   Wt 271 lb (122.9 kg)   BMI 37.80 kg/m   BP Readings from Last 3 Encounters:  02/04/16 132/75  09/18/15 122/82  06/11/15 (!) 141/88    Wt Readings from Last 3 Encounters:  02/04/16 271 lb (122.9 kg)  10/11/15 268 lb (121.6 kg)  09/18/15 268 lb 9.6 oz (121.8 kg)     Physical Exam  Musculoskeletal: He exhibits tenderness (at the plantar heel on right. Also at lateral right first toe. No erythema. mildly tender).   Unable to dorsiflex the right foot  Lab Results  Component Value Date   WBC 5.5 09/18/2015   HGB 13.0 (A) 06/23/2014   HCT 40.0 09/18/2015   PLT 262 09/18/2015   GLUCOSE 102 (H) 09/18/2015   CHOL 198 09/18/2015   TRIG 117 09/18/2015   HDL 63 09/18/2015   LDLCALC 112 (H)  09/18/2015   ALT 33 09/18/2015   AST 28 09/18/2015   NA 138 09/18/2015   K 3.9 09/18/2015   CL 96 09/18/2015   CREATININE 1.13 09/18/2015   BUN 21 09/18/2015   CO2 23 09/18/2015   TSH 2.170 12/22/2012   PSA 0.2 12/22/2012   INR 0.9 12/07/2008    Dg Chest 2 View  Result Date: 12/07/2008 Clinical Data: Lumbar HNP, preop.  Cough, congestion.  CHEST - 2 VIEW  Comparison: None  Findings: There are low lung volumes.  Heart is mildly enlarged. No confluent opacities, effusions or edema.  No acute bony abnormality.  IMPRESSION: Low lung volumes.  Mild cardiomegaly. Provider: Sherol DadeMatthew Wiersma  Dg Lumbar Spine 2-3 Views  Result Date: 12/07/2008 Clinical Data: History given of lumbar herniated disc.  Left L4-L5 far left discectomy.  LUMBAR SPINE - 2-3 VIEW  Comparison: None  Findings: Portable lateral localization images were obtained.  Image obtained at 1100 hours shows the tip of a metallic instrument projecting over the spinous process of L4.  Portable cross-table examination at 1130 hours shows metallic instrument projecting at the L4-L5 level.  IMPRESSION: Localization images were obtained. Provider: Rachael DarbyJerry Bowen, Wendi MayaLauren Greeson   Assessment & Plan:   Frank Herrera was seen today for foot pain.  Diagnoses and all orders for this visit:  Foot drop, right foot -     Ambulatory referral to Orthopedics  Chronic heel pain, right -     Ambulatory referral to Orthopedics  Other orders -     Flu Vaccine QUAD 36+ mos IM    I have discontinued Frank Herrera's hyoscyamine, ciclopirox, and diclofenac. I am also having him maintain his hydrochlorothiazide, allopurinol, metoprolol tartrate, and amLODipine.  No orders of the defined types were placed in this encounter.    Follow-up: Return if symptoms worsen or fail to improve.  Mechele ClaudeWarren Katesha Eichel, M.D.

## 2016-03-09 ENCOUNTER — Other Ambulatory Visit: Payer: Self-pay | Admitting: Family Medicine

## 2016-03-26 ENCOUNTER — Ambulatory Visit: Payer: BLUE CROSS/BLUE SHIELD | Admitting: Family Medicine

## 2016-03-31 ENCOUNTER — Ambulatory Visit (INDEPENDENT_AMBULATORY_CARE_PROVIDER_SITE_OTHER): Payer: BLUE CROSS/BLUE SHIELD | Admitting: Family Medicine

## 2016-03-31 ENCOUNTER — Encounter: Payer: Self-pay | Admitting: Family Medicine

## 2016-03-31 VITALS — BP 132/73 | HR 74 | Temp 98.5°F | Ht 71.0 in | Wt 281.0 lb

## 2016-03-31 DIAGNOSIS — M1000 Idiopathic gout, unspecified site: Secondary | ICD-10-CM | POA: Diagnosis not present

## 2016-03-31 DIAGNOSIS — I10 Essential (primary) hypertension: Secondary | ICD-10-CM | POA: Diagnosis not present

## 2016-03-31 NOTE — Progress Notes (Signed)
Subjective:  Patient ID: Frank Herrera, male    DOB: [birth year]  Age: 51 y.o. MRN: [id number]  CC: Hypertension (pt here today for routine follow up on HTN and medication refills also needs bloodwork)   HPI Frank Herrera presents for F follow-up of hypertension. Patient has no history of headache chest pain or shortness of breath or recent cough. Patient also denies symptoms of TIA such as numbness weakness lateralizing. Patient checks  blood pressure at home and has not had any elevated readings recently. Patient denies side effects from his medication. States taking it regularly. . Patient also followed for hyperuricemia. No flares of gouty arthritis since last check.   History Frank Herrera has a past medical history of Hypertension.   He has a past surgical history that includes Spine surgery and Hernia repair.   His family history includes Heart disease in his father; Hypertension in his brother, father, mother, and sister; Kidney disease in his father.He reports that he has never smoked. He has never used smokeless tobacco. He reports that he drinks alcohol. He reports that he does not use drugs.    ROS Review of Systems  Constitutional: Negative for chills, diaphoresis, fever and unexpected weight change.  HENT: Negative for congestion, hearing loss, rhinorrhea and sore throat.   Eyes: Negative for visual disturbance.  Respiratory: Negative for cough and shortness of breath.   Cardiovascular: Negative for chest pain.  Gastrointestinal: Negative for abdominal pain, constipation and diarrhea.  Genitourinary: Negative for dysuria and flank pain.  Musculoskeletal: Negative for arthralgias and joint swelling.  Skin: Negative for rash.  Neurological: Negative for dizziness and headaches.  Psychiatric/Behavioral: Negative for dysphoric mood and sleep disturbance.    Objective:  BP 132/73   Pulse 74   Temp 98.5 F (36.9 C) (Oral)   Ht 5' 11" (1.803 m)   Wt 281 lb (127.5 kg)    BMI 39.19 kg/m   BP Readings from Last 3 Encounters:  03/31/16 132/73  02/04/16 132/75  09/18/15 122/82    Wt Readings from Last 3 Encounters:  03/31/16 281 lb (127.5 kg)  02/04/16 271 lb (122.9 kg)  10/11/15 268 lb (121.6 kg)     Physical Exam  Constitutional: He is oriented to person, place, and time. He appears well-developed and well-nourished. No distress.  HENT:  Head: Normocephalic and atraumatic.  Right Ear: External ear normal.  Left Ear: External ear normal.  Nose: Nose normal.  Mouth/Throat: Oropharynx is clear and moist.  Eyes: Conjunctivae and EOM are normal. Pupils are equal, round, and reactive to light.  Neck: Normal range of motion. Neck supple. No thyromegaly present.  Cardiovascular: Normal rate, regular rhythm and normal heart sounds.   No murmur heard. Pulmonary/Chest: Effort normal and breath sounds normal. No respiratory distress. He has no wheezes. He has no rales.  Abdominal: Soft. Bowel sounds are normal. He exhibits no distension. There is no tenderness.  Lymphadenopathy:    He has no cervical adenopathy.  Neurological: He is alert and oriented to person, place, and time. He has normal reflexes.  Skin: Skin is warm and dry.  Psychiatric: He has a normal mood and affect. His behavior is normal. Judgment and thought content normal.    Dg Chest 2 View  Result Date: 12/07/2008 Clinical Data: Lumbar HNP, preop.  Cough, congestion.  CHEST - 2 VIEW  Comparison: None  Findings: There are low lung volumes.  Heart is mildly enlarged. No confluent opacities, effusions or edema.  No acute bony  abnormality.  IMPRESSION: Low lung volumes.  Mild cardiomegaly. Provider: Anne Herrera  Dg Lumbar Spine 2-3 Views  Result Date: 12/07/2008 Clinical Data: History given of lumbar herniated disc.  Left L4-L5 far left discectomy.  LUMBAR SPINE - 2-3 VIEW  Comparison: None  Findings: Portable lateral localization images were obtained.  Image obtained at 1100 hours shows  the tip of a metallic instrument projecting over the spinous process of L4.  Portable cross-table examination at 1130 hours shows metallic instrument projecting at the L4-L5 level.  IMPRESSION: Localization images were obtained. Provider: Mila Herrera, Lendon Collar   Assessment & Plan:   Frank Herrera was seen today for hypertension.  Diagnoses and all orders for this visit:  Essential hypertension -     CMP14+EGFR  Idiopathic gout, unspecified chronicity, unspecified site -     CMP14+EGFR -     Uric acid      I am having Frank Herrera maintain his hydrochlorothiazide, allopurinol, metoprolol tartrate, and amLODipine.  Allergies as of 03/31/2016   No Known Allergies     Medication List       Accurate as of 03/31/16 10:08 AM. Always use your most recent med list.          allopurinol 100 MG tablet Commonly known as:  ZYLOPRIM TAKE 2 TABLETS (200 MG TOTAL) BY MOUTH DAILY.   amLODipine 10 MG tablet Commonly known as:  NORVASC TAKE 1 TABLET (10 MG TOTAL) BY MOUTH DAILY.   hydrochlorothiazide 25 MG tablet Commonly known as:  HYDRODIURIL TAKE 1 TABLET (25 MG TOTAL) BY MOUTH DAILY.   metoprolol tartrate 25 MG tablet Commonly known as:  LOPRESSOR Take 1 tablet (25 mg total) by mouth 2 (two) times daily.        Follow-up: No Follow-up on file.  Frank Herrera, M.D.

## 2016-04-01 LAB — CMP14+EGFR
ALT: 27 IU/L (ref 0–44)
AST: 30 IU/L (ref 0–40)
Albumin/Globulin Ratio: 1.7 (ref 1.2–2.2)
Albumin: 4.5 g/dL (ref 3.5–5.5)
Alkaline Phosphatase: 96 IU/L (ref 39–117)
BUN/Creatinine Ratio: 19 (ref 9–20)
BUN: 16 mg/dL (ref 6–24)
Bilirubin Total: 0.6 mg/dL (ref 0.0–1.2)
CO2: 21 mmol/L (ref 18–29)
Calcium: 9.7 mg/dL (ref 8.7–10.2)
Chloride: 101 mmol/L (ref 96–106)
Creatinine, Ser: 0.86 mg/dL (ref 0.76–1.27)
GFR calc Af Amer: 119 mL/min/{1.73_m2} (ref >59)
GFR calc non Af Amer: 103 mL/min/{1.73_m2} (ref >59)
Globulin, Total: 2.7 g/dL (ref 1.5–4.5)
Glucose: 101 mg/dL — ABNORMAL HIGH (ref 65–99)
Potassium: 4 mmol/L (ref 3.5–5.2)
Sodium: 141 mmol/L (ref 134–144)
Total Protein: 7.2 g/dL (ref 6.0–8.5)

## 2016-04-01 LAB — URIC ACID: Uric Acid: 6.4 mg/dL (ref 3.7–8.6)

## 2016-04-07 ENCOUNTER — Telehealth: Payer: Self-pay | Admitting: Neurology

## 2016-04-07 NOTE — Telephone Encounter (Signed)
Dr. Anne HahnWillis' 1:30 EMG cancelled for today if there's any way this patient could come in then. If not, will be glad to check on the Feb appt.

## 2016-04-07 NOTE — Telephone Encounter (Signed)
Dr Lucia GaskinsAhern- did you hear back from Dr Shon BatonBrooks? If he only wants EMG.NCS I was speaking with Albin FellingCarla and we can try and get him in sooner

## 2016-04-07 NOTE — Telephone Encounter (Signed)
He has bilateral foot drop. Dahari would like us to see him first. Please place him on my schedule even this week if we can, let's discuss tomorrow thank you !!!

## 2016-04-07 NOTE — Telephone Encounter (Signed)
If Dr. Anne HahnWillis or Dr. Terrace ArabiaYan or Dr. Marjory LiesPenumalli have an emg/ncs opening before I do in March you can move them to the earlier slot but doesn't look like Ihave anything sooner and have been struggling to get patient in for emg/ncs. Would you mind looking to see if the other docs have anything sooner? Let me know thanks

## 2016-04-07 NOTE — Telephone Encounter (Signed)
Dr Delorise JacksonBrooks/[facility] (c) [phone] called request to speak with Dr A. Patient has bil foot drop(rt>lt) pt has appt 3/1 for NCV/EMG, he is wanting to get patient seen sooner, he is most interested in NCV at this time. Please call

## 2016-04-07 NOTE — Telephone Encounter (Signed)
Dr Lucia GaskinsAhern- Do you want to try and fit him in with a different provider? Or work something out with Beau/lorraine to fit him in sooner?

## 2016-04-07 NOTE — Telephone Encounter (Addendum)
Victorino DikeJennifer- can you speak with Dr Anne HahnWillis and see if he is ok with seeing pt on 05/06/16? I placed that appt on hold for EMG/NCS until you can speak with him

## 2016-04-07 NOTE — Telephone Encounter (Signed)
Spoke to Frank Herrera, she will check on Feb appt and see if Dr Frank Herrera agrees

## 2016-04-08 NOTE — Telephone Encounter (Signed)
Dr Lucia GaskinsAhern- patient is scheduled for new pt appt on 04/10/16. Thanks

## 2016-04-08 NOTE — Telephone Encounter (Signed)
Gave to [place] in referrals to call patient to schedule. Advised this Thursday there is an opening at 4pm

## 2016-04-10 ENCOUNTER — Encounter: Payer: Self-pay | Admitting: Neurology

## 2016-04-10 ENCOUNTER — Ambulatory Visit (INDEPENDENT_AMBULATORY_CARE_PROVIDER_SITE_OTHER): Payer: BLUE CROSS/BLUE SHIELD | Admitting: Neurology

## 2016-04-10 VITALS — BP 128/79 | HR 73 | Ht 71.0 in | Wt 279.0 lb

## 2016-04-10 DIAGNOSIS — M21372 Foot drop, left foot: Secondary | ICD-10-CM | POA: Diagnosis not present

## 2016-04-10 DIAGNOSIS — M21371 Foot drop, right foot: Secondary | ICD-10-CM | POA: Diagnosis not present

## 2016-04-10 DIAGNOSIS — G603 Idiopathic progressive neuropathy: Secondary | ICD-10-CM

## 2016-04-10 NOTE — Progress Notes (Signed)
[facility]    Provider:  Dr Jaynee Eagles Referring Provider: Melina Schools, MD Primary Care Physician:  Melina Schools, MD  CC:  Bilateral foot drop  HPI:  Frank Herrera is a 51 y.o. male here as a referral from Dr. Livia Snellen for bilateral foot drop. Started 6 months ago in the right foot. He was walking funny. No inciting events, no trauma, no falls, no alcohol use, no car wrecks, did not sleep funny on it, no use of high boots or anything that restricts his lower leg or pushes on the knee area. It slowly progressed in the right foot. Then a few months after he started seeing weakness in the left foot. Right is more affected than the left. Left is affected as well but not worsening. He noticed his right foot drop in bed one morning. Big toe was numb on the right toe and continues to be numb but the left foot without numbness. No other numbness or pain. He gets cramps in the feet. No diabetes. He has back pain but no raidular symptoms. No change in bowel and bladder. He did have radicular pain in 2010 and had surgery. But since then no issues with the back. No other weakness. Left foot is not worsening, he cannot flex his right foot at all. No FHx of neuropathy. Mother had carpal tunnel just in one hand and had surgery years ago but no other issues. Has not gained or lost weight recently. Bilateral steppage gait. No falls.No other focal neurologic deficits, associated symptoms, inciting events or modifiable factors.    Reviewed notes, labs and imaging from outside physicians, which showed:   Reviewed notes from [facility] orthopedics. Patient was seen there for back pain. He reported bilateral foot symptoms including low back pain and numbness at the right great toe which began 7 months ago without any known injury gradually progressive. Symptoms included pain, numbness of the right great toe and foot drop bilaterally. No pain with coughing or sneezing, incontinence of stool or  incontinence of urine. Severity of the symptoms is mild. Symptoms progressive, worsening. He was treated with activity modification. He does have a past medical history of herniated disc and previous back surgery in 2010 for herniated nucleus pulposus. He does have bilateral foot orthoses. Exam was significant for intact reflexes, decreased sensation in the lower extremity, no clonus, negative Hoffman sign, negative bilateral straight leg raise, bilateral dorsiflexion 0 out of 5 plantarflexion 5 out of 5.  Review of Systems: Patient complains of symptoms per HPI as well as the following symptoms:no CP, no SOB. Pertinent negatives per HPI. All others negative.   Social History   Social History  . Marital status: Single    Spouse name: N/A  . Number of children: 0  . Years of education: 12   Occupational History  . Frank Herrera    Social History Main Topics  . Smoking status: Never Smoker  . Smokeless tobacco: Never Used  . Alcohol use Yes     Comment: Twice per month  . Drug use: No  . Sexual activity: Not on file   Other Topics Concern  . Not on file   Social History Narrative   Lives with mother   Caffeine use: Tea/soda- sometimes   Drinks mostly water    Family History  Problem Relation Age of Onset  . Hypertension Mother   . Kidney disease Father   . Hypertension Father   . Heart disease Father   . Hypertension Brother   .  Hypertension Sister   . Neuropathy Neg Hx     Past Medical History:  Diagnosis Date  . Hypertension     Past Surgical History:  Procedure Laterality Date  . HERNIA REPAIR  1990  . SPINE SURGERY  2010    Current Outpatient Prescriptions  Medication Sig Dispense Refill  . allopurinol (ZYLOPRIM) 100 MG tablet TAKE 2 TABLETS (200 MG TOTAL) BY MOUTH DAILY. 180 tablet 0  . amLODipine (NORVASC) 10 MG tablet TAKE 1 TABLET (10 MG TOTAL) BY MOUTH DAILY. 90 tablet 1  . hydrochlorothiazide (HYDRODIURIL) 25 MG tablet TAKE 1 TABLET (25 MG TOTAL)  BY MOUTH DAILY. 90 tablet 3  . metoprolol tartrate (LOPRESSOR) 25 MG tablet Take 1 tablet (25 mg total) by mouth 2 (two) times daily. 180 tablet 1   No current facility-administered medications for this visit.     Allergies as of 04/10/2016  . (No Known Allergies)    Vitals: BP 128/79 (BP Location: Right Arm, Patient Position: Sitting, Cuff Size: Large)   Pulse 73   Ht _0  (1.803 m)   Wt 279 lb (126.6 kg)   BMI 38.91 kg/m  Last Weight:  Wt Readings from Last 1 Encounters:  04/10/16 279 lb (126.6 kg)   Last Height:   Ht Readings from Last 1 Encounters:  04/10/16 _1  (1.803 m)    Physical exam: Exam: Gen: NAD, conversant, well nourised, obese, well groomed                     CV: RRR, no MRG. No Carotid Bruits. No peripheral edema, warm, nontender Eyes: Conjunctivae clear without exudates or hemorrhage  Neuro: Detailed Neurologic Exam  Speech:    Speech is normal; fluent and spontaneous with normal comprehension.  Cognition:    The patient is oriented to person, place, and time;     recent and remote memory intact;     language fluent;     normal attention, concentration,     fund of knowledge Cranial Nerves:    The pupils are equal, round, and reactive to light. The fundi are normal and spontaneous venous pulsations are present. Visual fields are full to finger confrontation. Extraocular movements are intact. Trigeminal sensation is intact and the muscles of mastication are normal. The face is symmetric. The palate elevates in the midline. Hearing intact. Voice is normal. Shoulder shrug is normal. The tongue has normal motion without fasciculations.   Coordination:    Normal finger to nose and heel to shin. Normal rapid alternating movements.   Gait: Bilateral steppage gait  Motor Observation:    no involuntary movements noted. Tone:    Normal muscle tone.    Posture:    Posture is normal. normal erect    Strength: 5- right hip flexion, 4+/5 right  biceps femoris, left DF 1/5, right DF 0/5. Impaired inversion bilaterally with questionable weakness foot eversion bilaterally. Otherwise strength is V/V in the upper and lower limbs.      Sensation: decreased sensation right lateral foot and right lateral lower leg, right great toe and possibly to a mild extent the right dorsum of the foot and 1st interspace, left foot intact sensation     Reflex Exam:  DTR's: intact patellars, trace AJs. Otherwise deep tendon reflexes in the upper and lower extremities are normal bilaterally.   Toes:    The toes are downgoing bilaterally.   Clonus:    Clonus is absent.      Assessment/Plan:  This  is a 51 year old male with slowly progressive bilateral foot drop with the right more affected than the left. No inciting events or trauma. Exam significant for bilateral steppage gait, weakness of dorsiflexion, inversion and eversion of foot, right foot decreased sensation in the dorsum and right lateral foot and lower leg. The weakness pattern does not appear to localize solely to the peroneal nerve as both inversion and eversion are affected but need emg/ncs. Differential includes radiculopathy, peroneal peripheral neuropathy, sciatic neuropathy, Lumbosacral plexus neuropathy (idiopathic plexitis, diabetic plexus neuropathy, vasculitis). Inherited disorders such as Charcot-Marie-Tooth syndrome, Hereditary Neuropathy with Liability to Pressure Palsy or peroneal muscular atrophy. Other less likely causes include: diabetes, alcohol, and Guillain-Barre syndrome. Central causes would be unlikely in bilateral foot drop but can't rule this out.   Extensive lab testing  Need MRI CD and report from Dr Rolena Infante will request Emg/ncs bilateral lowers  CC; Dr. Rolena Infante  Orders Placed This Encounter  Procedures  . Angiotensin converting enzyme  . TSH  . HIV antibody  . Sedimentation rate  . ANA w/Reflex  . B12 and Folate Panel  . Sjogren's syndrome antibods(ssa + ssb)  .  Pan-ANCA  . RPR  . Hepatitis C antibody  . Rheumatoid factor  . Heavy metals, blood  . Vitamin B6  . Multiple Myeloma Panel (SPEP&IFE w/QIG)  . B. burgdorfi Antibody  . Vitamin B1  . Methylmalonic acid, serum  . CBC  . Comprehensive metabolic panel  . Hemoglobin A1c  . CK  . NCV with EMG(electromyography)    Frank Ill, MD  [facility] [address] [place], [place] [address]  Phone [phone] Fax [phone]

## 2016-04-12 ENCOUNTER — Encounter: Payer: Self-pay | Admitting: Neurology

## 2016-04-12 DIAGNOSIS — M21371 Foot drop, right foot: Secondary | ICD-10-CM | POA: Insufficient documentation

## 2016-04-12 DIAGNOSIS — M21372 Foot drop, left foot: Secondary | ICD-10-CM | POA: Insufficient documentation

## 2016-04-14 ENCOUNTER — Telehealth: Payer: Self-pay | Admitting: *Deleted

## 2016-04-14 ENCOUNTER — Telehealth: Payer: Self-pay

## 2016-04-14 NOTE — Telephone Encounter (Signed)
Pt is currently scheduled 05/08/16 for EMG/NCS.

## 2016-04-14 NOTE — Telephone Encounter (Addendum)
Pt lumbar report and MRI L spine Cd on [place] desk.

## 2016-04-14 NOTE — Telephone Encounter (Signed)
-----   Message from Hillis RangeEmma L King, RN sent at 04/14/2016 11:07 AM EST ----- Victorino DikeJennifer,    This is the patient Dr Lucia GaskinsAhern needs to fit in earlier for EMG/NCS. She is waiting to hear from Beau.  She also needs MRI report and CD  from Dr Shon BatonBrooks. Maybe you can ask Candi LeashDebra Settle in medical records to get this?   Thank you! Kara MeadEmma, RN  ----- Message ----- From: Anson FretAntonia B Ahern, MD Sent: 04/12/2016  11:35 AM To: Anson FretAntonia B Ahern, MD, Hillis RangeEmma L King, RN, #  Disregard previous emails. Mr Frank Herrera does not need emg/ncs or MRI lumbar spine CD it is this patient thanks.

## 2016-04-16 NOTE — Telephone Encounter (Signed)
Sooner appt scheduled 04/17/16.

## 2016-04-17 ENCOUNTER — Ambulatory Visit (INDEPENDENT_AMBULATORY_CARE_PROVIDER_SITE_OTHER): Payer: Self-pay | Admitting: Neurology

## 2016-04-17 ENCOUNTER — Ambulatory Visit (INDEPENDENT_AMBULATORY_CARE_PROVIDER_SITE_OTHER): Payer: BLUE CROSS/BLUE SHIELD | Admitting: Neurology

## 2016-04-17 DIAGNOSIS — M21371 Foot drop, right foot: Secondary | ICD-10-CM | POA: Diagnosis not present

## 2016-04-17 DIAGNOSIS — M21372 Foot drop, left foot: Secondary | ICD-10-CM

## 2016-04-17 DIAGNOSIS — Z0289 Encounter for other administrative examinations: Secondary | ICD-10-CM

## 2016-04-17 NOTE — Progress Notes (Signed)
[facility]    Provider:  Dr Karelly Dewalt Referring Provider: Dahari Brooks, MD  History:  Frank Herrera is a 51 y.o. male here as a referral from Dr. Brooks for evaluation of bilateral foot drop.   Summary: Nerve conduction studies were performed on the bilateral lower extremities.  Summary: Evaluation of the right and left peroneal motor nerves showed no response (Ankle).  The right peroneal (TA) motor nerve showed delayed distal onset latency (10.2ms, N< 6.7) and reduced amplitude (1.2mV, N>3).  The left peroneal (TA) motor nerve showed delayed distal onset latency (9.4ms, N< 6.7) and reduced amplitude (2.4mV, N>3).  The right Tibial F wave showed delayed latency (56.8ms, N<56). The left Tibial F wave showed delayed latency (56.8ms, N<56). The left H wave latency was delayed (37.7ms, N<35).  All remaining nerves (as indicated in the following tables) were within normal limits.    EMG needle study was performed on selected right lower extremity muscles. Could not sample paraspinals as these are unreliable after surgery:  The right Anterior Tibialis, Posterior Tibialis and Extensor Hallucis Longus muscles showed increased spontaneous activity (+3 PSW and Fibs); patient could not activate due to weakness. The right Medial Gastrocnemius showed increased spontaneous activity (+1 PSW and Fibs) and mildly reduced motor unit recruitment. The right Peroneal Longus showed increased spontaneous activity (+3 PSW and Fibs), polyphasic and reduced motor unit recruitment The right biceps femoris (long head) showed showed increased spontaneous activity (+2 PSW and Fibs), polyphasic and reduced motor unit recruitment. The Vastus Medialis, Gluteus Maximus, Gluteus Medius were within normal limits  Could not sample paraspinals as these are unreliable after surgery:   Conclusion: This is an abnormal study.  The acute/ongoing denervation and chronic neurogenic changes seen in distal and proximal  muscles supplied by the L5 nerve roots in conjunction with absent or reduced peroneal motor conductions, delayed Tibial F waves and normal sensory conductions consistent with bilateral L5 radiculopathy.  Croy Drumwright, MD [facility]    SNC    Nerve / Sites Rec. Site Peak Lat Ref. Amp.1-2 Ref. Distance    ms ms V V cm  L Sural - Ankle (Calf)     Calf Ankle 3.75 ?4.40 8.4 ?6.0 14  R Sural - Ankle (Calf)     Calf Ankle 3.23 ?4.40 10.9 ?6.0 14  L Superficial peroneal - Ankle     Lat leg Ankle 3.80 ?4.40 6.4 ?6.0 14  R Superficial peroneal - Ankle     Lat leg Ankle 3.70 ?4.40 7.0 ?6.0 14     MNC    Nerve / Sites Muscle Latency Ref. Amplitude Ref. Rel Amp Segments Distance Lat Diff Velocity Ref. Area    ms ms mV mV %  cm ms m/s m/s mVms  L Peroneal - EDB     Ankle EDB NR ?6.5 NR ?2.0 NR Ankle - EDB 9    NR     Fib head EDB NR  NR  NR Fib head - Ankle  NR  ?44 NR         Pop fossa - Ankle  NR     R Peroneal - EDB     Ankle EDB NR ?6.5 NR ?2.0 NR Ankle - EDB 9    NR     Fib head EDB NR  NR  NR Fib head - Ankle  NR  ?44 NR         Pop fossa - Ankle  NR     L   Tibial - AH     Ankle AH 4.1 ?5.8 4.0 ?4.0 100 Ankle - AH 9    11.6     Pop fossa AH 14.4  4.2  103 Pop fossa - Ankle 42 10.3 41 ?41   R Tibial - AH     Ankle AH 4.3 ?5.8 4.2 ?4.0 100 Ankle - AH 9    9.6     Pop fossa AH 14.7  2.7  64.3 Pop fossa - Ankle 42 10.4 40 ?41 7.6  L Peroneal - Tib Ant     Fib Head Tib Ant 9.4 ?6.7 2.4 ?3.0 100 Fib Head - Tib Ant 10    4.1     Pop fossa Tib Ant 11.8  2.3  93.5 Pop fossa - Fib Head 10 2.4 42 ?44 3.9  R Peroneal - Tib Ant     Fib Head Tib Ant 10.2 ?6.7 1.2 ?3.0 100 Fib Head - Tib Ant 10    2.7     Pop fossa Tib Ant 12.6  1.2  95.6 Pop fossa - Fib Head 10 2.4 41 ?44 3.0     F  Wave    Nerve F Lat Ref.   ms ms  L Peroneal - EDB  ?56.0  L Tibial - AH 56.9 ?56.0  R Tibial - AH 58.4 ?56.0     H Reflex    Nerve H Lat Lat Hmax   ms ms   Left Right Ref. Left  Right Ref.  Tibial - Soleus 37.7 34.8 ?35.0 38.6 37.7 ?35.0     EMG full                                      

## 2016-04-18 NOTE — Progress Notes (Signed)
See procedure note.

## 2016-04-20 NOTE — Procedures (Signed)
[facility]    Provider:  Dr Lucia Gaskins Referring Provider: Venita Lick, MD  History:  Frank Herrera is a 51 y.o. male here as a referral from Dr. Shon Baton for evaluation of bilateral foot drop.   Summary: Nerve conduction studies were performed on the bilateral lower extremities.  Summary: Evaluation of the right and left peroneal motor nerves showed no response (Ankle).  The right peroneal (TA) motor nerve showed delayed distal onset latency (10.30ms, N< 6.7) and reduced amplitude (1.50mV, N>3).  The left peroneal (TA) motor nerve showed delayed distal onset latency (9.25ms, N< 6.7) and reduced amplitude (2.20mV, N>3).  The right Tibial F wave showed delayed latency (56.4ms, N<56). The left Tibial F wave showed delayed latency (56.19ms, N<56). The left H wave latency was delayed (37.69ms, N<35).  All remaining nerves (as indicated in the following tables) were within normal limits.    EMG needle study was performed on selected right lower extremity muscles. Could not sample paraspinals as these are unreliable after surgery:  The right Anterior Tibialis, Posterior Tibialis and Extensor Hallucis Longus muscles showed increased spontaneous activity (+3 PSW and Fibs); patient could not activate due to weakness. The right Medial Gastrocnemius showed increased spontaneous activity (+1 PSW and Fibs) and mildly reduced motor unit recruitment. The right Peroneal Longus showed increased spontaneous activity (+3 PSW and Fibs), polyphasic and reduced motor unit recruitment The right biceps femoris (long head) showed showed increased spontaneous activity (+2 PSW and Fibs), polyphasic and reduced motor unit recruitment. The Vastus Medialis, Gluteus Maximus, Gluteus Medius were within normal limits  Could not sample paraspinals as these are unreliable after surgery:   Conclusion: This is an abnormal study.  The acute/ongoing denervation and chronic neurogenic changes seen in distal and proximal  muscles supplied by the L5 nerve roots in conjunction with absent or reduced peroneal motor conductions, delayed Tibial F waves and normal sensory conductions consistent with bilateral L5 radiculopathy.  Naomie Dean, MD [facility]    [facility]    Nerve / Sites Rec. Site Peak Lat Ref. Amp.1-2 Ref. Distance    ms ms V V cm  L Sural - Ankle (Calf)     Calf Ankle 3.75 ?4.40 8.4 ?6.0 14  R Sural - Ankle (Calf)     Calf Ankle 3.23 ?4.40 10.9 ?6.0 14  L Superficial peroneal - Ankle     Lat leg Ankle 3.80 ?4.40 6.4 ?6.0 14  R Superficial peroneal - Ankle     Lat leg Ankle 3.70 ?4.40 7.0 ?6.0 14     [facility]    Nerve / Sites Muscle Latency Ref. Amplitude Ref. Rel Amp Segments Distance Lat Diff Velocity Ref. Area    ms ms mV mV %  cm ms m/s m/s mVms  L Peroneal - EDB     Ankle EDB NR ?6.5 NR ?2.0 NR Ankle - EDB 9    NR     Fib head EDB NR  NR  NR Fib head - Ankle  NR  ?44 NR         Pop fossa - Ankle  NR     R Peroneal - EDB     Ankle EDB NR ?6.5 NR ?2.0 NR Ankle - EDB 9    NR     Fib head EDB NR  NR  NR Fib head - Ankle  NR  ?44 NR         Pop fossa - Ankle  NR     L  Tibial - AH     Ankle AH 4.1 ?5.8 4.0 ?4.0 100 Ankle - AH 9    11.6     Pop fossa AH 14.4  4.2  103 Pop fossa - Ankle 42 10.3 41 ?41   R Tibial - AH     Ankle AH 4.3 ?5.8 4.2 ?4.0 100 Ankle - AH 9    9.6     Pop fossa AH 14.7  2.7  64.3 Pop fossa - Ankle 42 10.4 40 ?41 7.6  L Peroneal - Tib Ant     Fib Head Tib Ant 9.4 ?6.7 2.4 ?3.0 100 Fib Head - Tib Ant 10    4.1     Pop fossa Tib Ant 11.8  2.3  93.5 Pop fossa - Fib Head 10 2.4 42 ?44 3.9  R Peroneal - Tib Ant     Fib Head Tib Ant 10.2 ?6.7 1.2 ?3.0 100 Fib Head - Tib Ant 10    2.7     Pop fossa Tib Ant 12.6  1.2  95.6 Pop fossa - Fib Head 10 2.4 41 ?44 3.0     F  Wave    Nerve F Lat Ref.   ms ms  L Peroneal - EDB  ?56.0  L Tibial - AH 56.9 ?56.0  R Tibial - AH 58.4 ?56.0     H Reflex    Nerve H Lat Lat Hmax   ms ms   Left Right Ref. Left  Right Ref.  Tibial - Soleus 37.7 34.8 ?35.0 38.6 37.7 ?35.0     EMG full

## 2016-04-25 ENCOUNTER — Telehealth: Payer: Self-pay | Admitting: Neurology

## 2016-04-25 NOTE — Telephone Encounter (Signed)
All patient need more studies

## 2016-04-30 ENCOUNTER — Other Ambulatory Visit: Payer: Self-pay | Admitting: Family Medicine

## 2016-05-01 NOTE — Telephone Encounter (Signed)
Frank Herrera, please call patient. I have discussed his case with Dr. Shon BatonBrooks and we want to rule out every other possibility before considering surgery. That means 2 more MRIs to image his nerve in his proximal legas and in his pelvis. These are 2 other locations that nerve compression can be seen. I think financially this may be difficult so I want to know how patient feels about this. thanks

## 2016-05-01 NOTE — Telephone Encounter (Signed)
Left VM for pt to return call.

## 2016-05-02 NOTE — Telephone Encounter (Signed)
Called pt w/ recommendations. Says that he will think over the weekend about having/paying for additional MRIs and agreed to call back on Monday to discuss further.

## 2016-05-04 ENCOUNTER — Other Ambulatory Visit: Payer: Self-pay | Admitting: Neurology

## 2016-05-04 NOTE — Progress Notes (Signed)
error 

## 2016-05-08 ENCOUNTER — Encounter: Payer: BLUE CROSS/BLUE SHIELD | Admitting: Neurology

## 2016-05-10 ENCOUNTER — Other Ambulatory Visit: Payer: Self-pay | Admitting: Family Medicine

## 2016-06-10 ENCOUNTER — Other Ambulatory Visit: Payer: Self-pay | Admitting: Family Medicine

## 2016-07-05 ENCOUNTER — Other Ambulatory Visit: Payer: Self-pay | Admitting: Family Medicine

## 2016-08-26 ENCOUNTER — Other Ambulatory Visit: Payer: Self-pay | Admitting: Family Medicine

## 2016-09-05 ENCOUNTER — Other Ambulatory Visit: Payer: Self-pay | Admitting: Family Medicine

## 2016-09-11 ENCOUNTER — Ambulatory Visit (INDEPENDENT_AMBULATORY_CARE_PROVIDER_SITE_OTHER): Payer: BLUE CROSS/BLUE SHIELD | Admitting: Physician Assistant

## 2016-09-11 ENCOUNTER — Encounter: Payer: Self-pay | Admitting: Physician Assistant

## 2016-09-11 ENCOUNTER — Ambulatory Visit (INDEPENDENT_AMBULATORY_CARE_PROVIDER_SITE_OTHER): Payer: BLUE CROSS/BLUE SHIELD

## 2016-09-11 VITALS — BP 111/67 | HR 61 | Temp 97.8°F | Ht 71.0 in | Wt 274.0 lb

## 2016-09-11 DIAGNOSIS — M25571 Pain in right ankle and joints of right foot: Secondary | ICD-10-CM

## 2016-09-11 MED ORDER — MELOXICAM 15 MG PO TABS: 15.0000 mg | ORAL_TABLET | Freq: Every day | ORAL | 0 refills | Status: DC

## 2016-09-11 NOTE — Patient Instructions (Signed)
Ankle Pain Many things can cause ankle pain, including an injury to the area and overuse of the ankle.The ankle joint holds your body weight and allows you to move around. Ankle pain can occur on either side or the back of one ankle or both ankles. Ankle pain may be sharp and burning or dull and aching. There may be tenderness, stiffness, redness, or warmth around the ankle. Follow these instructions at home: Activity  Rest your ankle as told by your health care provider. Avoid any activities that cause ankle pain.  Do exercises as told by your health care provider.  Ask your health care provider if you can drive. Using a brace, a bandage, or crutches  If you were given a brace: ? Wear it as told by your health care provider. ? Remove it when you take a bath or a shower. ? Try not to move your ankle very much, but wiggle your toes from time to time. This helps to prevent swelling.  If you were given an elastic bandage: ? Remove it when you take a bath or a shower. ? Try not to move your ankle very much, but wiggle your toes from time to time. This helps to prevent swelling. ? Adjust the bandage to make it more comfortable if it feels too tight. ? Loosen the bandage if you have numbness or tingling in your foot or if your foot turns cold and blue.  If you have crutches, use them as told by your health care provider. Continue to use them until you can walk without feeling pain in your ankle. Managing pain, stiffness, and swelling  Raise (elevate) your ankle above the level of your heart while you are sitting or lying down.  If directed, apply ice to the area: ? Put ice in a plastic bag. ? Place a towel between your skin and the bag. ? Leave the ice on for 20 minutes, 2-3 times per day. General instructions  Keep all follow-up visits as told by your health care provider. This is important.  Record this information that may be helpful for you and your health care provider: ? How  often you have ankle pain. ? Where the pain is located. ? What the pain feels like.  Take over-the-counter and prescription medicines only as told by your health care provider. Contact a health care provider if:  Your pain gets worse.  Your pain is not relieved with medicines.  You have a fever or chills.  You are having more trouble with walking.  You have new symptoms. Get help right away if:  Your foot, leg, toes, or ankle tingles or becomes numb.  Your foot, leg, toes, or ankle becomes swollen.  Your foot, leg, toes, or ankle turns pale or blue. This information is not intended to replace advice given to you by your health care provider. Make sure you discuss any questions you have with your health care provider. Document Released: 08/14/2009 Document Revised: 10/26/2015 Document Reviewed: 09/26/2014 Elsevier Interactive Patient Education  2017 Elsevier Inc.  

## 2016-09-11 NOTE — Progress Notes (Signed)
Subjective:     Patient ID: Frank Herrera, male   DOB: 10/15/1965, 51 y.o.   MRN: [id number]  HPI Pt with R lateral ankle pain and swelling He has had chronic foot and ankle issues but this is different Pain is worse with weightbearing Audible pop to the ankle Pt had to leave work due to sx  Review of Systems  Constitutional: Positive for activity change.  Musculoskeletal: Positive for arthralgias, joint swelling and myalgias.       Objective:   Physical Exam  Constitutional: He appears well-developed and well-nourished.  Nursing note and vitals reviewed. + edema to the ankle No ecchy, erythema, or induration noted FROM to the ankle with audible pop heard Minimal TTP of the ankle Good pulses/sensory to the foot Xray- see labs       Assessment:     1. Acute right ankle pain        Plan:     Heat/Ice Mobic 15mg  q d prn #14 Hold OTC NSAIDS Work Note F/U prn

## 2016-09-17 ENCOUNTER — Telehealth: Payer: Self-pay | Admitting: *Deleted

## 2016-09-17 NOTE — Telephone Encounter (Signed)
Yes, fine to extend work note (this is for ankle pain, yes?)

## 2016-09-17 NOTE — Telephone Encounter (Signed)
Spoke with GI; At this time there are no cancellations,but the patient can call to check at any time. [phone] His test takes 2 slots, so the chances of cancellations back to back are not very good. He is claustrophobic so is having to use the open MRI.

## 2016-09-17 NOTE — Telephone Encounter (Signed)
Pt aware that his appointment is the first available at this time. He was given the number to GI so that he could call for possible cancellations.  He is still needing to know about a work note.

## 2016-09-17 NOTE — Telephone Encounter (Signed)
[facility] to x-ray 

## 2016-09-18 NOTE — Telephone Encounter (Signed)
Patient states it is for his ankle pain, letter placed up front. Patient aware.

## 2016-09-24 ENCOUNTER — Telehealth: Payer: Self-pay | Admitting: Family Medicine

## 2016-09-24 NOTE — Telephone Encounter (Signed)
NOTE FAXED

## 2016-09-24 NOTE — Telephone Encounter (Signed)
Work note reprinted Pt notified ready for pick up

## 2016-09-28 ENCOUNTER — Ambulatory Visit
Admission: RE | Admit: 2016-09-28 | Discharge: 2016-09-28 | Disposition: A | Discharge status: Home / Self Care | Payer: BLUE CROSS/BLUE SHIELD | Source: Ambulatory Visit | Attending: Physician Assistant | Admitting: Physician Assistant

## 2016-09-28 DIAGNOSIS — M25571 Pain in right ankle and joints of right foot: Secondary | ICD-10-CM

## 2016-09-28 MED ORDER — GADOBENATE DIMEGLUMINE 529 MG/ML IV SOLN
20.0000 mL | Freq: Once | INTRAVENOUS | Status: AC | PRN
  Administered 2016-09-28: 20 mL via INTRAVENOUS

## 2016-09-29 ENCOUNTER — Ambulatory Visit (INDEPENDENT_AMBULATORY_CARE_PROVIDER_SITE_OTHER): Payer: BLUE CROSS/BLUE SHIELD | Admitting: Family Medicine

## 2016-09-29 ENCOUNTER — Encounter: Payer: Self-pay | Admitting: Family Medicine

## 2016-09-29 VITALS — BP 119/71 | HR 71 | Temp 97.6°F | Ht 71.0 in | Wt 276.0 lb

## 2016-09-29 DIAGNOSIS — S92324A Nondisplaced fracture of second metatarsal bone, right foot, initial encounter for closed fracture: Secondary | ICD-10-CM | POA: Diagnosis not present

## 2016-09-29 DIAGNOSIS — S92214A Nondisplaced fracture of cuboid bone of right foot, initial encounter for closed fracture: Secondary | ICD-10-CM

## 2016-09-29 NOTE — Progress Notes (Signed)
Chief Complaint  Patient presents with  . Foot Pain    pt here today following up on his right foot/ankle pain and had an MRI yesterday.    HPI  Patient presents today for Continued pain in the foot. He says it's controllable with over-the-counter analgesia. He was worried about taking the meloxicam because of the package insert stating that it can cause heart attack and death. She reports that the pain is manageable except when he has to stand on the foot for an extended period of time. This is the case at his work. He saw Mr. Zenda AlpersWebster 2 weeks ago and an MRI was ordered. That report is attached. Reviewed today showing Achilles tendinitis as well as fractures of the cuboid and second metatarsal. These have apparently been going on ever since he was here several months ago. He says that when he went to the orthopedist that worked on his back but not his foot.  PMH: Smoking status noted ROS: Per HPI  Objective: BP 119/71   Pulse 71   Temp 97.6 F (36.4 C) (Oral)   Ht 5\' 11"  (1.803 m)   Wt 276 lb (125.2 kg)   BMI 38.49 kg/m  Gen: NAD, alert, cooperative with exam HEENT: NCAT, EOMI, PERRL CV: RRR, good S1/S2, no murmur Resp: CTABL, no wheezes, non-labored Abd: SNTND, BS present, no guarding or organomegaly Ext: No edema, warm Neuro: Alert and oriented, No gross deficits  Assessment and plan:  1. Closed nondisplaced fracture of cuboid of right foot, initial encounter   2. Closed nondisplaced fracture of second metatarsal bone of right foot, initial encounter     Patient declined pain medication. We decided together that just keeping him out of work so he could be off of his feet would be more than adequate for pain relief at this time. Cast boot was dispensed T shoewear that any time he does have to be on his feet.  Orders Placed This Encounter  Procedures  . Ambulatory referral to Orthopedics    Referral Priority:   Urgent    Referral Type:   Consultation    Number of Visits  Requested:   1    Follow up as needed. And in about 2 months for physical, once released by orthopedics.  Mechele ClaudeWarren Pia Jedlicka, MD

## 2016-10-06 ENCOUNTER — Other Ambulatory Visit: Payer: Self-pay | Admitting: Family Medicine

## 2016-10-07 ENCOUNTER — Telehealth: Payer: Self-pay

## 2016-10-07 NOTE — Telephone Encounter (Signed)
MRI of ankle showed "stress fracture of the cuboid on his right foot". Received 10/03/16 OV notes from Dr. Victorino DikeHewitt w/ plan to "immobilize in a Cam walker boot on the right. This will serve as an AFO as well, which should help to improve his gait. He needs to follow-up w/ Dr. Lucia GaskinsAhern for further evaluation of his bilateral foot drop." Sent to med records for scanning, copy to Dr. Lucia GaskinsAhern for review.

## 2016-10-14 NOTE — Telephone Encounter (Signed)
LVM for pt to call to schedule f/u appt for foot drop  FYI

## 2016-11-05 ENCOUNTER — Ambulatory Visit (INDEPENDENT_AMBULATORY_CARE_PROVIDER_SITE_OTHER): Payer: BLUE CROSS/BLUE SHIELD | Admitting: Neurology

## 2016-11-05 ENCOUNTER — Encounter: Payer: Self-pay | Admitting: Neurology

## 2016-11-05 VITALS — BP 148/90 | HR 81 | Ht 70.0 in | Wt 281.8 lb

## 2016-11-05 DIAGNOSIS — M21372 Foot drop, left foot: Secondary | ICD-10-CM | POA: Diagnosis not present

## 2016-11-05 DIAGNOSIS — M5417 Radiculopathy, lumbosacral region: Secondary | ICD-10-CM | POA: Diagnosis not present

## 2016-11-05 DIAGNOSIS — M21371 Foot drop, right foot: Secondary | ICD-10-CM | POA: Diagnosis not present

## 2016-11-05 NOTE — Patient Instructions (Signed)
Remember to drink plenty of fluid, eat healthy meals and do not skip any meals. Try to eat protein with a every meal and eat a healthy snack such as fruit or nuts in between meals. Try to keep a regular sleep-wake schedule and try to exercise daily, particularly in the form of walking, 20-30 minutes a day, if you can.   As far as diagnostic testing: [place] [facility] second opinion  I would like to see you back after [facility] appointment if needed, sooner if we need to. Please call us with any interim questions, concerns, problems, updates or refill requests.   Our phone number is [phone]. We also have an after hours call service for urgent matters and there is a physician on-call for urgent questions. For any emergencies you know to call 911 or go to the nearest emergency room

## 2016-11-05 NOTE — Progress Notes (Signed)
[facility]    Provider:  Dr Lucia GaskinsAhern Referring Provider: Venita Lickahari Brooks, MD Primary Care Physician:  Venita Lickahari Brooks, MD  CC:  Bilateral foot drop  Interval history 11/05/2016: Patient still here with bilateral foot drop. EMG/NCS c/w bilateral L5 radic. MRI lumbar spine c/w left L5 nerve root displacement and right foraminal stenosis which supports the diagnosis. Will refer to [facility].  EMG/NCS: Conclusion: This is an abnormal study.  The acute/ongoing denervation and chronic neurogenic changes seen in distal and proximal muscles supplied by the L5 nerve roots in conjunction with absent or reduced peroneal motor conductions, delayed Tibial F waves and normal sensory conductions consistent with bilateral L5 radiculopathy.  HPI:  Frank Herrera is a 51 y.o. male here as a referral from Dr. Darlyn ReadStacks for bilateral foot drop. Started 6 months ago in the right foot. He was walking funny. No inciting events, no trauma, no falls, no alcohol use, no car wrecks, did not sleep funny on it, no use of high boots or anything that restricts his lower leg or pushes on the knee area. It slowly progressed in the right foot. Then a few months after he started seeing weakness in the left foot. Right is more affected than the left. Left is affected as well but not worsening. He noticed his right foot drop in bed one morning. Big toe was numb on the right toe and continues to be numb but the left foot without numbness. No other numbness or pain. He gets cramps in the feet. No diabetes. He has back pain but no raidular symptoms. No change in bowel and bladder. He did have radicular pain in 2010 and had surgery. But since then no issues with the back. No other weakness. Left foot is not worsening, he cannot flex his right foot at all. No FHx of neuropathy. Mother had carpal tunnel just in one hand and had surgery years ago but no other issues. Has not gained or lost weight recently. Bilateral steppage gait. No  falls.No other focal neurologic deficits, associated symptoms, inciting events or modifiable factors.   Reviewed notes, labs and imaging from outside physicians, which showed:   [facility] Patient was seen there for back pain. He reported bilateral foot symptoms including low back pain and numbness at the right great toe which began 7 months ago without any known injury gradually progressive. Symptoms included pain, numbness of the right great toe and foot drop bilaterally. No pain with coughing or sneezing, incontinence of stool or incontinence of urine. Severity of the symptoms is mild. Symptoms progressive, worsening. He was treated with activity modification. He does have a past medical history of herniated disc and previous back surgery in 2010 for herniated nucleus pulposus. He does have bilateral foot orthoses. Exam was significant for intact reflexes, decreased sensation in the lower extremity, no clonus, negative Hoffman sign, negative bilateral straight leg raise, bilateral dorsiflexion 0 out of 5 plantarflexion 5 out of 5.  Review of Systems: Patient complains of symptoms per HPI as well as the following symptoms:no CP, no SOB. Pertinent negatives per HPI. All others negative.   Social History   Social History  . Marital status: Single    Spouse name: N/A  . Number of children: 0  . Years of education: 12   Occupational History  . [employer]    Social History Main Topics  . Smoking status: Never Smoker  . Smokeless tobacco: Never Used  . Alcohol use Yes  Comment: Twice per month  . Drug use: No  . Sexual activity: Not on file   Other Topics Concern  . Not on file   Social History Narrative   Lives with mother   Caffeine use: Tea/soda- sometimes   Drinks mostly water   Right handed    Family History  Problem Relation Age of Onset  . Hypertension Mother   . Kidney disease Father   . Hypertension Father   . Heart  disease Father   . Hypertension Brother   . Hypertension Sister   . Neuropathy Neg Hx     Past Medical History:  Diagnosis Date  . Hypertension     Past Surgical History:  Procedure Laterality Date  . HERNIA REPAIR  1990  . SPINE SURGERY  2010    Current Outpatient Prescriptions  Medication Sig Dispense Refill  . allopurinol (ZYLOPRIM) 100 MG tablet TAKE 2 TABLETS BY MOUTH EVERY DAY 60 tablet 0  . amLODipine (NORVASC) 10 MG tablet TAKE 1 TABLET (10 MG TOTAL) BY MOUTH DAILY. 90 tablet 1  . hydrochlorothiazide (HYDRODIURIL) 25 MG tablet TAKE 1 TABLET (25 MG TOTAL) BY MOUTH DAILY. 90 tablet 0  . meloxicam (MOBIC) 15 MG tablet Take 1 tablet (15 mg total) by mouth daily. 14 tablet 0  . metoprolol tartrate (LOPRESSOR) 25 MG tablet TAKE 1 TABLET (25 MG TOTAL) BY MOUTH 2 (TWO) TIMES DAILY. 180 tablet 0   No current facility-administered medications for this visit.     Allergies as of 11/05/2016  . (No Known Allergies)    Vitals: BP (!) 148/90 (BP Location: Right Arm, Patient Position: Sitting, Cuff Size: Normal)   Pulse 81   Ht 5\' 10"  (1.778 m)   Wt 281 lb 12.8 oz (127.8 kg)   SpO2 98%   BMI 40.43 kg/m  Last Weight:  Wt Readings from Last 1 Encounters:  11/05/16 281 lb 12.8 oz (127.8 kg)   Last Height:   Ht Readings from Last 1 Encounters:  11/05/16 5\' 10"  (1.778 m)     Physical exam: Exam: Gen: NAD, conversant, well nourised, obese, well groomed                     CV: RRR, no MRG. No Carotid Bruits. No peripheral edema, warm, nontender Eyes: Conjunctivae clear without exudates or hemorrhage  Neuro: Detailed Neurologic Exam  Speech:    Speech is normal; fluent and spontaneous with normal comprehension.  Cognition:    The patient is oriented to person, place, and time;     recent and remote memory intact;     language fluent;     normal attention, concentration,     fund of knowledge Cranial Nerves:    The pupils are equal, round, and reactive to light.  The fundi are normal and spontaneous venous pulsations are present. Visual fields are full to finger confrontation. Extraocular movements are intact. Trigeminal sensation is intact and the muscles of mastication are normal. The face is symmetric. The palate elevates in the midline. Hearing intact. Voice is normal. Shoulder shrug is normal. The tongue has normal motion without fasciculations.   Coordination:    Normal finger to nose and heel to shin. Normal rapid alternating movements.   Gait: Bilateral steppage gait  Motor Observation:    no involuntary movements noted. Tone:    Normal muscle tone.    Posture:    Posture is normal. normal erect    Strength: 5- right hip flexion, 4+/5 right  biceps femoris, left DF 1/5, right DF 0/5. Impaired inversion bilaterally with questionable weakness foot eversion bilaterally. Otherwise strength is V/V in the upper and lower limbs.      Sensation: decreased sensation right lateral foot and right lateral lower leg, right great toe and possibly to a mild extent the right dorsum of the foot and 1st interspace, left foot intact sensation     Reflex Exam:  DTR's: intact patellars, trace AJs. Otherwise deep tendon reflexes in the upper and lower extremities are normal bilaterally.   Toes:    The toes are downgoing bilaterally.   Clonus:    Clonus is absent.      Assessment/Plan:  This is a 51 year old male with slowly progressive bilateral foot drop with the right more affected than the left. No inciting events or trauma. Exam significant for bilateral steppage gait, weakness of dorsiflexion, inversion and eversion of foot, right foot decreased sensation in the dorsum and right lateral foot and lower leg. The weakness pattern does not appear to localize solely to the peroneal nerve as both inversion and eversion are affected. Differential includes radiculopathy, peroneal peripheral neuropathy, sciatic neuropathy, Lumbosacral plexus neuropathy  (idiopathic plexitis, diabetic plexus neuropathy, vasculitis). Inherited disorders such as Charcot-Marie-Tooth syndrome, Hereditary Neuropathy with Liability to Pressure Palsy or peroneal muscular atrophy. Other less likely causes include: diabetes, alcohol, and Guillain-Barre syndrome. Central causes would be unlikely in bilateral foot drop but can't rule this out.   - Patient still here with bilateral foot drop. EMG/NCS c/w bilateral L5 radic. MRI lumbar spine c/w left L5 nerve root displacement and right foraminal stenosis which supports the diagnosis. Will refer to [facility] for second opinion.  - Patient will bring CD images of MRI L-Spine with him  CC; Dr. Gean Quint, MD  [facility] [address] [place], [place] [address]  Phone [phone] Fax [phone]  A total of 15 minutes was spent face-to-face with this patient. Over half this time was spent on counseling patient on the L5 radiculopathy diagnosis and different diagnostic and therapeutic options available.

## 2016-11-08 ENCOUNTER — Other Ambulatory Visit: Payer: Self-pay | Admitting: Family Medicine

## 2016-11-11 NOTE — Telephone Encounter (Signed)
Next OV 12/01/16 

## 2016-11-18 ENCOUNTER — Other Ambulatory Visit: Payer: Self-pay | Admitting: Nurse Practitioner

## 2016-12-01 ENCOUNTER — Encounter: Payer: BLUE CROSS/BLUE SHIELD | Admitting: Family Medicine

## 2016-12-03 ENCOUNTER — Other Ambulatory Visit: Payer: Self-pay | Admitting: Family Medicine

## 2016-12-11 ENCOUNTER — Encounter: Payer: Self-pay | Admitting: Family Medicine

## 2016-12-11 ENCOUNTER — Ambulatory Visit (INDEPENDENT_AMBULATORY_CARE_PROVIDER_SITE_OTHER): Payer: BLUE CROSS/BLUE SHIELD | Admitting: Family Medicine

## 2016-12-11 VITALS — BP 126/77 | HR 76 | Temp 97.1°F | Ht 70.0 in | Wt 285.0 lb

## 2016-12-11 DIAGNOSIS — Z0000 Encounter for general adult medical examination without abnormal findings: Secondary | ICD-10-CM

## 2016-12-11 DIAGNOSIS — Z23 Encounter for immunization: Secondary | ICD-10-CM | POA: Diagnosis not present

## 2016-12-11 DIAGNOSIS — I10 Essential (primary) hypertension: Secondary | ICD-10-CM

## 2016-12-11 DIAGNOSIS — M1000 Idiopathic gout, unspecified site: Secondary | ICD-10-CM

## 2016-12-11 LAB — URINALYSIS
Bilirubin, UA: NEGATIVE
Glucose, UA: NEGATIVE
Ketones, UA: NEGATIVE
Leukocytes, UA: NEGATIVE
Nitrite, UA: NEGATIVE
Protein, UA: NEGATIVE
RBC, UA: NEGATIVE
Specific Gravity, UA: 1.025 (ref 1.005–1.030)
Urobilinogen, Ur: 0.2 mg/dL (ref 0.2–1.0)
pH, UA: 6 (ref 5.0–7.5)

## 2016-12-11 MED ORDER — ALLOPURINOL 100 MG PO TABS: 200.0000 mg | ORAL_TABLET | Freq: Every day | ORAL | 1 refills | Status: DC

## 2016-12-11 MED ORDER — METOPROLOL TARTRATE 25 MG PO TABS
25.0000 mg | ORAL_TABLET | Freq: Two times a day (BID) | ORAL | 1 refills | Status: DC
Start: 2016-12-11 — End: 2017-06-22

## 2016-12-11 MED ORDER — AMLODIPINE BESYLATE 10 MG PO TABS: ORAL_TABLET | ORAL | 1 refills | Status: DC

## 2016-12-11 MED ORDER — HYDROCHLOROTHIAZIDE 25 MG PO TABS: ORAL_TABLET | ORAL | 1 refills | Status: DC

## 2016-12-11 NOTE — Progress Notes (Signed)
Subjective:  Patient ID: Frank Herrera, male    DOB: [birth year]  Age: 51 y.o. MRN: [id number]  CC: Annual Exam (pt here today for CPE, no other concerns voiced.)   HPI Frank Herrera presents forCPE/Wellness  Depression screen [facility] 2/9 12/11/2016 09/29/2016 09/11/2016  Decreased Interest 0 0 0  Down, Depressed, Hopeless 0 0 0  PHQ - 2 Score 0 0 0    History Frank Herrera has a past medical history of Hypertension.   Frank Herrera has a past surgical history that includes Spine surgery (2010) and Hernia repair (1990).   His family history includes Heart disease in his father; Hypertension in his brother, father, mother, and sister; Kidney disease in his father.Frank Herrera reports that Frank Herrera has never smoked. Frank Herrera has never used smokeless tobacco. Frank Herrera reports that Frank Herrera drinks alcohol. Frank Herrera reports that Frank Herrera does not use drugs.    ROS Review of Systems  Constitutional: Negative for activity change, appetite change, chills, diaphoresis, fatigue, fever and unexpected weight change.  HENT: Negative for congestion, ear pain, hearing loss (Denies hearing loss but has been trying to get excessive wax out of his left ear. Frank Herrera tried using home treatment without success and would like to have lavage.), postnasal drip, rhinorrhea, sore throat, tinnitus and trouble swallowing.   Eyes: Negative for photophobia, pain, discharge and redness.  Respiratory: Negative for apnea, cough, choking, chest tightness, shortness of breath, wheezing and stridor.   Cardiovascular: Negative for chest pain, palpitations and leg swelling.  Gastrointestinal: Negative for abdominal distention, abdominal pain, blood in stool, constipation, diarrhea, nausea and vomiting.  Endocrine: Negative for cold intolerance, heat intolerance, polydipsia, polyphagia and polyuria.  Genitourinary: Negative for difficulty urinating, dysuria, enuresis, flank pain, frequency, genital sores, hematuria and urgency.  Musculoskeletal: Negative for arthralgias and joint swelling.    Skin: Negative for color change, rash and wound.  Allergic/Immunologic: Negative for immunocompromised state.  Neurological: Negative for dizziness, tremors, seizures, syncope, facial asymmetry, speech difficulty, weakness, light-headedness, numbness and headaches.  Hematological: Does not bruise/bleed easily.  Psychiatric/Behavioral: Negative for agitation, behavioral problems, confusion, decreased concentration, dysphoric mood, hallucinations, sleep disturbance and suicidal ideas. The patient is not nervous/anxious and is not hyperactive.     Objective:  BP 126/77   Pulse 76   Temp (!) 97.1 F (36.2 C) (Oral)   Ht '5\' 10"'$  (1.778 m)   Wt 285 lb (129.3 kg)   BMI 40.89 kg/m   BP Readings from Last 3 Encounters:  12/11/16 126/77  11/05/16 (!) 148/90  09/29/16 119/71    Wt Readings from Last 3 Encounters:  12/11/16 285 lb (129.3 kg)  11/05/16 281 lb 12.8 oz (127.8 kg)  09/29/16 276 lb (125.2 kg)     Physical Exam  Constitutional: Frank Herrera is oriented to person, place, and time. Frank Herrera appears well-developed and well-nourished.  HENT:  Head: Normocephalic and atraumatic.  Right Ear: Hearing normal.  Left Ear: Hearing normal.  Mouth/Throat: Oropharynx is clear and moist.  Left external auditory canal occluded with cerumen.  Eyes: Pupils are equal, round, and reactive to light. EOM are normal.  Neck: Normal range of motion. No tracheal deviation present. No thyromegaly present.  Cardiovascular: Normal rate, regular rhythm and normal heart sounds.  Exam reveals no gallop and no friction rub.   No murmur heard. Pulmonary/Chest: Breath sounds normal. Frank Herrera has no wheezes. Frank Herrera has no rales.  Abdominal: Soft. Frank Herrera exhibits no mass. There is no tenderness.  Genitourinary: Testes normal and penis normal.  Genitourinary Comments: Bilateral hydrocele  Musculoskeletal: Normal range of motion. Frank Herrera exhibits no edema.  Neurological: Frank Herrera is alert and oriented to person, place, and time.  Skin: Skin is  warm and dry.  Psychiatric: Frank Herrera has a normal mood and affect.      Assessment & Plan:   Frank Herrera was seen today for annual exam.  Diagnoses and all orders for this visit:  Well adult exam -     CBC with Differential/Platelet -     CMP14+EGFR -     Lipid panel -     Uric acid -     Urinalysis  Essential hypertension -     CBC with Differential/Platelet -     CMP14+EGFR  Idiopathic gout, unspecified chronicity, unspecified site -     Uric acid -     Urinalysis  Severe obesity (BMI >= 40) (HCC) -     Lipid panel  Other orders -     allopurinol (ZYLOPRIM) 100 MG tablet; Take 2 tablets (200 mg total) by mouth daily. -     amLODipine (NORVASC) 10 MG tablet; TAKE 1 TABLET (10 MG TOTAL) BY MOUTH DAILY. -     hydrochlorothiazide (HYDRODIURIL) 25 MG tablet; TAKE 1 TABLET (25 MG TOTAL) BY MOUTH DAILY. -     metoprolol tartrate (LOPRESSOR) 25 MG tablet; Take 1 tablet (25 mg total) by mouth 2 (two) times daily.     Ear lavage performed successfully.  I have discontinued Frank Herrera allopurinol. I have also changed his allopurinol. Additionally, I am having him maintain his meloxicam, amLODipine, hydrochlorothiazide, and metoprolol tartrate.  Allergies as of 12/11/2016   No Known Allergies     Medication List       Accurate as of 12/11/16 10:30 AM. Always use your most recent med list.          allopurinol 100 MG tablet Commonly known as:  ZYLOPRIM Take 2 tablets (200 mg total) by mouth daily.   amLODipine 10 MG tablet Commonly known as:  NORVASC TAKE 1 TABLET (10 MG TOTAL) BY MOUTH DAILY.   hydrochlorothiazide 25 MG tablet Commonly known as:  HYDRODIURIL TAKE 1 TABLET (25 MG TOTAL) BY MOUTH DAILY.   meloxicam 15 MG tablet Commonly known as:  MOBIC Take 1 tablet (15 mg total) by mouth daily.   metoprolol tartrate 25 MG tablet Commonly known as:  LOPRESSOR Take 1 tablet (25 mg total) by mouth 2 (two) times daily.        Follow-up: Return in about 6 months  (around 06/11/2017).  [name], M.D.

## 2016-12-12 LAB — CBC WITH DIFFERENTIAL/PLATELET
Basophils Absolute: 0 10*3/uL (ref 0.0–0.2)
Basos: 0 %
EOS (ABSOLUTE): 0.2 10*3/uL (ref 0.0–0.4)
Eos: 3 %
Hematocrit: 39.4 % (ref 37.5–51.0)
Hemoglobin: 12.9 g/dL — ABNORMAL LOW (ref 13.0–17.7)
Immature Grans (Abs): 0 10*3/uL (ref 0.0–0.1)
Immature Granulocytes: 0 %
Lymphocytes Absolute: 1.7 10*3/uL (ref 0.7–3.1)
Lymphs: 34 %
MCH: 29.3 pg (ref 26.6–33.0)
MCHC: 32.7 g/dL (ref 31.5–35.7)
MCV: 90 fL (ref 79–97)
Monocytes Absolute: 0.4 10*3/uL (ref 0.1–0.9)
Monocytes: 9 %
Neutrophils Absolute: 2.7 10*3/uL (ref 1.4–7.0)
Neutrophils: 54 %
Platelets: 260 10*3/uL (ref 150–379)
RBC: 4.4 x10E6/uL (ref 4.14–5.80)
RDW: 13.5 % (ref 12.3–15.4)
WBC: 5 10*3/uL (ref 3.4–10.8)

## 2016-12-12 LAB — CMP14+EGFR
ALT: 35 IU/L (ref 0–44)
AST: 34 IU/L (ref 0–40)
Albumin/Globulin Ratio: 1.8 (ref 1.2–2.2)
Albumin: 4.6 g/dL (ref 3.5–5.5)
Alkaline Phosphatase: 95 IU/L (ref 39–117)
BUN/Creatinine Ratio: 14 (ref 9–20)
BUN: 14 mg/dL (ref 6–24)
Bilirubin Total: 0.6 mg/dL (ref 0.0–1.2)
CO2: 24 mmol/L (ref 20–29)
Calcium: 9.8 mg/dL (ref 8.7–10.2)
Chloride: 100 mmol/L (ref 96–106)
Creatinine, Ser: 0.99 mg/dL (ref 0.76–1.27)
GFR calc Af Amer: 103 mL/min/{1.73_m2} (ref >59)
GFR calc non Af Amer: 89 mL/min/{1.73_m2} (ref >59)
Globulin, Total: 2.6 g/dL (ref 1.5–4.5)
Glucose: 108 mg/dL — ABNORMAL HIGH (ref 65–99)
Potassium: 3.9 mmol/L (ref 3.5–5.2)
Sodium: 140 mmol/L (ref 134–144)
Total Protein: 7.2 g/dL (ref 6.0–8.5)

## 2016-12-12 LAB — LIPID PANEL
Chol/HDL Ratio: 3.4 ratio (ref 0.0–5.0)
Cholesterol, Total: 187 mg/dL (ref 100–199)
HDL: 55 mg/dL
LDL Calculated: 112 mg/dL — ABNORMAL HIGH (ref 0–99)
Triglycerides: 100 mg/dL (ref 0–149)
VLDL Cholesterol Cal: 20 mg/dL (ref 5–40)

## 2016-12-12 LAB — URIC ACID: Uric Acid: 6.8 mg/dL (ref 3.7–8.6)

## 2017-05-26 ENCOUNTER — Other Ambulatory Visit: Payer: Self-pay | Admitting: *Deleted

## 2017-05-26 MED ORDER — ALLOPURINOL 100 MG PO TABS: 200.0000 mg | ORAL_TABLET | Freq: Every day | ORAL | 0 refills | Status: DC

## 2017-06-12 ENCOUNTER — Ambulatory Visit: Payer: BLUE CROSS/BLUE SHIELD | Admitting: Family Medicine

## 2017-06-22 ENCOUNTER — Encounter: Payer: Self-pay | Admitting: Family Medicine

## 2017-06-22 ENCOUNTER — Ambulatory Visit: Payer: BLUE CROSS/BLUE SHIELD | Admitting: Family Medicine

## 2017-06-22 VITALS — BP 137/84 | HR 58 | Temp 97.2°F | Ht 70.0 in | Wt 282.0 lb

## 2017-06-22 DIAGNOSIS — I10 Essential (primary) hypertension: Secondary | ICD-10-CM

## 2017-06-22 DIAGNOSIS — M1000 Idiopathic gout, unspecified site: Secondary | ICD-10-CM

## 2017-06-22 DIAGNOSIS — Z1211 Encounter for screening for malignant neoplasm of colon: Secondary | ICD-10-CM | POA: Diagnosis not present

## 2017-06-22 MED ORDER — ALLOPURINOL 100 MG PO TABS: 200.0000 mg | ORAL_TABLET | Freq: Every day | ORAL | 0 refills | Status: DC

## 2017-06-22 MED ORDER — HYDROCHLOROTHIAZIDE 25 MG PO TABS: ORAL_TABLET | ORAL | 1 refills | Status: DC

## 2017-06-22 MED ORDER — AMLODIPINE BESYLATE 10 MG PO TABS: ORAL_TABLET | ORAL | 1 refills | Status: DC

## 2017-06-22 MED ORDER — METOPROLOL TARTRATE 25 MG PO TABS: 25.0000 mg | ORAL_TABLET | Freq: Two times a day (BID) | ORAL | 1 refills | Status: DC

## 2017-06-22 NOTE — Progress Notes (Signed)
Subjective:  Patient ID: Frank Herrera, male    DOB: 06-26-1965  Age: 52 y.o. MRN: [id number]  CC: Hypertension (pt here today for routine follow up of his chronic medical conditions)   HPI Frank Herrera presents for follow-up of hypertension. Patient has no history of headache chest pain or shortness of breath or recent cough. Patient also denies symptoms of TIA such as numbness weakness lateralizing. Patient checks  blood pressure at home and has not had any elevated readings recently. Patient denies side effects from his medication. States taking it regularly. Patient also is followed for gout.  Frank Herrera has had no exacerbations since his last visit.  Additionally Frank Herrera is taking his Zyloprim regularly.  Depression screen [facility] 2/9 12/11/2016 09/29/2016 09/11/2016  Decreased Interest 0 0 0  Down, Depressed, Hopeless 0 0 0  PHQ - 2 Score 0 0 0    History Frank Herrera has a past medical history of Hypertension.   Frank Herrera has a past surgical history that includes Spine surgery (2010) and Hernia repair (1990).   His family history includes Heart disease in his father; Hypertension in his brother, father, mother, and sister; Kidney disease in his father.Frank Herrera reports that Frank Herrera has never smoked. Frank Herrera has never used smokeless tobacco. Frank Herrera reports that Frank Herrera drinks alcohol. Frank Herrera reports that Frank Herrera does not use drugs.    ROS Review of Systems  Constitutional: Negative.   HENT: Negative.   Eyes: Negative for visual disturbance.  Respiratory: Negative for cough and shortness of breath.   Cardiovascular: Negative for chest pain and leg swelling.  Gastrointestinal: Negative for abdominal pain, diarrhea, nausea and vomiting.  Genitourinary: Negative for difficulty urinating.  Musculoskeletal: Negative for arthralgias and myalgias.  Skin: Negative for rash.  Neurological: Negative for headaches.  Psychiatric/Behavioral: Negative for sleep disturbance.    Objective:  BP 137/84   Pulse (!) 58   Temp (!) 97.2 F (36.2 C)  (Oral)   Ht _0  (1.778 m)   Wt 282 lb (127.9 kg)   BMI 40.46 kg/m   BP Readings from Last 3 Encounters:  06/22/17 137/84  12/11/16 126/77  11/05/16 (!) 148/90    Wt Readings from Last 3 Encounters:  06/22/17 282 lb (127.9 kg)  12/11/16 285 lb (129.3 kg)  11/05/16 281 lb 12.8 oz (127.8 kg)     Physical Exam  Constitutional: Frank Herrera is oriented to person, place, and time. Frank Herrera appears well-developed and well-nourished. No distress.  HENT:  Head: Normocephalic and atraumatic.  Right Ear: External ear normal.  Left Ear: External ear normal.  Nose: Nose normal.  Mouth/Throat: Oropharynx is clear and moist.  Eyes: Pupils are equal, round, and reactive to light. Conjunctivae and EOM are normal.  Neck: Normal range of motion. Neck supple. No thyromegaly present.  Cardiovascular: Normal rate, regular rhythm and normal heart sounds.  No murmur heard. Pulmonary/Chest: Effort normal and breath sounds normal. No respiratory distress. Frank Herrera has no wheezes. Frank Herrera has no rales.  Abdominal: Soft. Bowel sounds are normal. Frank Herrera exhibits no distension. There is no tenderness.  Lymphadenopathy:    Frank Herrera has no cervical adenopathy.  Neurological: Frank Herrera is alert and oriented to person, place, and time. Frank Herrera has normal reflexes.  Skin: Skin is warm and dry.  Psychiatric: Frank Herrera has a normal mood and affect. His behavior is normal. Judgment and thought content normal.      Assessment & Plan:   Frank Herrera was seen today for hypertension.  Diagnoses and all orders for this visit:  Essential  hypertension -     CMP14+EGFR  Screening for colon cancer -     Ambulatory referral to Gastroenterology  Idiopathic gout, unspecified chronicity, unspecified site  Other orders -     allopurinol (ZYLOPRIM) 100 MG tablet; Take 2 tablets (200 mg total) by mouth daily. -     amLODipine (NORVASC) 10 MG tablet; TAKE 1 TABLET (10 MG TOTAL) BY MOUTH DAILY. -     hydrochlorothiazide (HYDRODIURIL) 25 MG tablet; TAKE 1 TABLET (25 MG  TOTAL) BY MOUTH DAILY. -     metoprolol tartrate (LOPRESSOR) 25 MG tablet; Take 1 tablet (25 mg total) by mouth 2 (two) times daily.       I am having Frank Herrera. Frank Herrera maintain his meloxicam, allopurinol, amLODipine, hydrochlorothiazide, and metoprolol tartrate.  Allergies as of 06/22/2017   No Known Allergies     Medication List        Accurate as of 06/22/17  5:20 PM. Always use your most recent med list.          allopurinol 100 MG tablet Commonly known as:  ZYLOPRIM Take 2 tablets (200 mg total) by mouth daily.   amLODipine 10 MG tablet Commonly known as:  NORVASC TAKE 1 TABLET (10 MG TOTAL) BY MOUTH DAILY.   hydrochlorothiazide 25 MG tablet Commonly known as:  HYDRODIURIL TAKE 1 TABLET (25 MG TOTAL) BY MOUTH DAILY.   meloxicam 15 MG tablet Commonly known as:  MOBIC Take 1 tablet (15 mg total) by mouth daily.   metoprolol tartrate 25 MG tablet Commonly known as:  LOPRESSOR Take 1 tablet (25 mg total) by mouth 2 (two) times daily.        Follow-up: Return in about 6 months (around 12/22/2017).  [name], M.D.

## 2017-06-23 LAB — CMP14+EGFR
ALT: 32 IU/L (ref 0–44)
AST: 34 IU/L (ref 0–40)
Albumin/Globulin Ratio: 1.8 (ref 1.2–2.2)
Albumin: 4.6 g/dL (ref 3.5–5.5)
Alkaline Phosphatase: 84 IU/L (ref 39–117)
BUN/Creatinine Ratio: 15 (ref 9–20)
BUN: 14 mg/dL (ref 6–24)
Bilirubin Total: 0.7 mg/dL (ref 0.0–1.2)
CO2: 23 mmol/L (ref 20–29)
Calcium: 9.6 mg/dL (ref 8.7–10.2)
Chloride: 102 mmol/L (ref 96–106)
Creatinine, Ser: 0.94 mg/dL (ref 0.76–1.27)
GFR calc Af Amer: 109 mL/min/{1.73_m2} (ref >59)
GFR calc non Af Amer: 94 mL/min/{1.73_m2} (ref >59)
Globulin, Total: 2.6 g/dL (ref 1.5–4.5)
Glucose: 88 mg/dL (ref 65–99)
Potassium: 4.1 mmol/L (ref 3.5–5.2)
Sodium: 140 mmol/L (ref 134–144)
Total Protein: 7.2 g/dL (ref 6.0–8.5)

## 2017-07-23 ENCOUNTER — Encounter: Payer: Self-pay | Admitting: Family Medicine

## 2017-08-13 ENCOUNTER — Telehealth: Payer: Self-pay | Admitting: Family Medicine

## 2017-08-13 NOTE — Telephone Encounter (Signed)
[facility] just whatever we can find for him there.

## 2017-09-07 DIAGNOSIS — Z86010 Personal history of colonic polyps: Secondary | ICD-10-CM | POA: Insufficient documentation

## 2017-10-19 ENCOUNTER — Telehealth: Payer: Self-pay | Admitting: Family Medicine

## 2017-10-19 NOTE — Telephone Encounter (Signed)
Left detailed message on patients voicemail about appt time and date.

## 2017-10-21 ENCOUNTER — Encounter: Payer: Self-pay | Admitting: Family Medicine

## 2017-10-21 ENCOUNTER — Ambulatory Visit: Payer: BLUE CROSS/BLUE SHIELD | Admitting: Family Medicine

## 2017-10-21 VITALS — BP 125/76 | HR 81 | Temp 96.9°F | Ht 70.0 in | Wt 277.1 lb

## 2017-10-21 DIAGNOSIS — K4090 Unilateral inguinal hernia, without obstruction or gangrene, not specified as recurrent: Secondary | ICD-10-CM | POA: Diagnosis not present

## 2017-10-21 NOTE — Progress Notes (Signed)
Chief Complaint  Patient presents with  . Hernia    pt here today c/o what he thinks is a hernia-right inguinal area    HPI  Patient presents today for bulging, sore area of right inguinal region. Onset over several weeks, gradually increasing in soreness and size.  PMH: Smoking status noted ROS: Per HPI  Objective: BP 125/76   Pulse 81   Temp (!) 96.9 F (36.1 C) (Oral)   Ht 5\' 10"  (1.778 m)   Wt 277 lb 2 oz (125.7 kg)   BMI 39.76 kg/m  Gen: NAD, alert, cooperative with exam HEENT: NCAT, EOMI, PERRL Abd: SNTND, BS present, no guarding or organomegaly. There is bulging noted on valsalva at the right inguinal ring. Reducable. Ext: No edema, warm Neuro: Alert and oriented, No gross deficits  Assessment and plan:  1. Right inguinal hernia     No orders of the defined types were placed in this encounter.   Orders Placed This Encounter  Procedures  . Ambulatory referral to General Surgery    Referral Priority:   Routine    Referral Type:   Surgical    Referral Reason:   Specialty Services Required    Requested Specialty:   General Surgery    Number of Visits Requested:   1    Follow up as needed.  Mechele ClaudeWarren Yonael Tulloch, MD

## 2017-10-25 ENCOUNTER — Encounter: Payer: Self-pay | Admitting: Family Medicine

## 2017-11-16 ENCOUNTER — Ambulatory Visit: Payer: Self-pay | Admitting: Surgery

## 2017-11-17 ENCOUNTER — Ambulatory Visit: Payer: Self-pay | Admitting: Surgery

## 2017-11-17 NOTE — H&P (Signed)
Frank Herrera Documented: 11/16/2017 3:47 PM Location: [facility] Patient #: [id number] DOB: 1965-05-12 Single / Language: [place] / Race: Black or African American Male   History of Present Illness [name] MD; 11/17/2017 8:30 AM) The patient is a 52 year old male who presents with an inguinal hernia. Note for "Inguinal hernia": ` ` ` Patient sent for surgical consultation at the request of Dr Mechele Claude  Chief Complaint: Right inguinal hernia ` ` The patient is a pleasant on smoking male with history of left inguinal hernia [place]. Concern for swelling and hernia on the opposite, right side. Saw his primary care physician who confirmed a right inguinal hernia. Surgical consultation requested.  Patient has some right ankle/foot issues associated with foot drop and walks with a cane but is otherwise active. He does not smoke. Moves his bowels most days. Has some hypertension and gout and chronic pain under control. No history of infections or urinary issues. He is obese but his weight is stable  (Review of systems as stated in this history (HPI) or in the review of systems. Otherwise all other 12 point ROS are negative) ` ` `   Past Surgical History [name]; 11/16/2017 3:47 PM) Colon Polyp Removal - Colonoscopy  Spinal Surgery - Lower Back   Diagnostic Studies History [name]; 11/16/2017 3:47 PM) Colonoscopy  within last year  Allergies [name]; 11/16/2017 3:48 PM) No Known Drug Allergies [11/16/2017]: Allergies Reconciled   Medication History [name]; 11/16/2017 3:48 PM) Allopurinol (100MG  Tablet, Oral) Active. amLODIPine Besylate (10MG  Tablet, Oral) Active. hydroCHLOROthiazide (25MG  Tablet, Oral) Active. Metoprolol Tartrate (25MG  Tablet, Oral) Active. Meloxicam (15MG  Tablet, Oral) Active. Medications Reconciled  Social History [name];  11/16/2017 3:47 PM) Alcohol use  Moderate alcohol use. Caffeine use  Tea. No drug use  Tobacco use  Never smoker.  Family History [name]; 11/16/2017 3:47 PM) Hypertension  Brother, Father. Kidney Disease  Father.  Other Problems [name]; 11/16/2017 3:47 PM) Back Pain  High blood pressure     Review of Systems [name]; 11/16/2017 3:47 PM) General Not Present- Appetite Loss, Chills, Fatigue, Fever, Night Sweats, Weight Gain and Weight Loss. Skin Not Present- Change in Wart/Mole, Dryness, Hives, Jaundice, New Lesions, Non-Healing Wounds, Rash and Ulcer. HEENT Present- Wears glasses/contact lenses. Not Present- Earache, Hearing Loss, Hoarseness, Nose Bleed, Oral Ulcers, Ringing in the Ears, Seasonal Allergies, Sinus Pain, Sore Throat, Visual Disturbances and Yellow Eyes. Respiratory Not Present- Bloody sputum, Chronic Cough, Difficulty Breathing, Snoring and Wheezing. Breast Not Present- Breast Mass, Breast Pain, Nipple Discharge and Skin Changes. Cardiovascular Not Present- Chest Pain, Difficulty Breathing Lying Down, Leg Cramps, Palpitations, Rapid Heart Rate, Shortness of Breath and Swelling of Extremities. Gastrointestinal Not Present- Abdominal Pain, Bloating, Bloody Stool, Change in Bowel Habits, Chronic diarrhea, Constipation, Difficulty Swallowing, Excessive gas, Gets full quickly at meals, Hemorrhoids, Indigestion, Nausea, Rectal Pain and Vomiting. Male Genitourinary Not Present- Blood in Urine, Change in Urinary Stream, Frequency, Impotence, Nocturia, Painful Urination, Urgency and Urine Leakage. Musculoskeletal Not Present- Back Pain, Joint Pain, Joint Stiffness, Muscle Pain, Muscle Weakness and Swelling of Extremities. Neurological Not Present- Decreased Memory, Fainting, Headaches, Numbness, Seizures, Tingling, Tremor, Trouble walking and Weakness. Psychiatric Not Present- Anxiety, Bipolar, Change in Sleep Pattern, Depression, Fearful and  Frequent crying. Endocrine Not Present- Cold Intolerance, Excessive Hunger, Hair Changes, Heat Intolerance, Hot flashes and New Diabetes. Hematology Not Present- Blood Thinners, Easy Bruising, Excessive bleeding, Gland problems, HIV and  Persistent Infections.  Vitals [name]; 11/16/2017 3:49 PM) 11/16/2017 3:48 PM Weight: 278.2 lb Height: 70in Body Surface Area: 2.4 m Body Mass Index: 39.92 kg/m  Pain Level: 0/10 Temp.: 98.37F(Temporal)  Pulse: 87 (Regular)  P.OX: 94% (Room air) BP: 170/100 (Sitting, Left Arm, Standard)       Physical Exam [name] MD; 11/17/2017 8:31 AM) General Mental Status-Alert. General Appearance-Not in acute distress, Not Sickly. Orientation-Oriented X3. Hydration-Well hydrated. Voice-Normal.  Integumentary Global Assessment Upon inspection and palpation of skin surfaces of the - Axillae: non-tender, no inflammation or ulceration, no drainage. and Distribution of scalp and body hair is normal. General Characteristics Temperature - normal warmth is noted.  Head and Neck Head-normocephalic, atraumatic with no lesions or palpable masses. Face Global Assessment - atraumatic, no absence of expression. Neck Global Assessment - no abnormal movements, no bruit auscultated on the right, no bruit auscultated on the left, no decreased range of motion, non-tender. Trachea-midline. Thyroid Gland Characteristics - non-tender.  Eye Eyeball - Left-Extraocular movements intact, No Nystagmus. Eyeball - Right-Extraocular movements intact, No Nystagmus. Cornea - Left-No Hazy. Cornea - Right-No Hazy. Sclera/Conjunctiva - Left-No scleral icterus, No Discharge. Sclera/Conjunctiva - Right-No scleral icterus, No Discharge. Pupil - Left-Direct reaction to light normal. Pupil - Right-Direct reaction to light normal. Note: Wears glasses. Vision corrected   ENMT Ears Pinna - Left - no drainage observed,  no generalized tenderness observed. Right - no drainage observed, no generalized tenderness observed. Nose and Sinuses External Inspection of the Nose - no destructive lesion observed. Inspection of the nares - Left - quiet respiration. Right - quiet respiration. Mouth and Throat Lips - Upper Lip - no fissures observed, no pallor noted. Lower Lip - no fissures observed, no pallor noted. Nasopharynx - no discharge present. Oral Cavity/Oropharynx - Tongue - no dryness observed. Oral Mucosa - no cyanosis observed. Hypopharynx - no evidence of airway distress observed.  Chest and Lung Exam Inspection Movements - Normal and Symmetrical. Accessory muscles - No use of accessory muscles in breathing. Palpation Palpation of the chest reveals - Non-tender. Auscultation Breath sounds - Normal and Clear.  Cardiovascular Auscultation Rhythm - Regular. Murmurs & Other Heart Sounds - Auscultation of the heart reveals - No Murmurs and No Systolic Clicks.  Abdomen Inspection Inspection of the abdomen reveals - No Visible peristalsis and No Abnormal pulsations. Umbilicus - No Bleeding, No Urine drainage. Palpation/Percussion Palpation and Percussion of the abdomen reveal - Soft, Non Tender, No Rebound tenderness, No Rigidity (guarding) and No Cutaneous hyperesthesia. Note: Moderate suprapubic umbilical diastases recti. Obese but soft. Nontender. Not distended. No umbilical or incisional hernias. No guarding.   Male Genitourinary Sexual Maturity Tanner 5 - Adult hair pattern and Adult penile size and shape. Note: Obvious right inguinal hernia. Sensitive but reducible. No evidence of any recurrent left inguinal hernia. Normal external male genitalia. No obvious testicular masses.   Peripheral Vascular Upper Extremity Inspection - Left - No Cyanotic nailbeds, Not Ischemic. Right - No Cyanotic nailbeds, Not Ischemic.  Neurologic Neurologic evaluation reveals -normal attention span and  ability to concentrate, able to name objects and repeat phrases. Appropriate fund of knowledge , normal sensation and normal coordination. Mental Status Affect - not angry, not paranoid. Cranial Nerves-Normal Bilaterally. Gait-Normal.  Neuropsychiatric Mental status exam performed with findings of-able to articulate well with normal speech/language, rate, volume and coherence, thought content normal with ability to perform basic computations and apply abstract reasoning and no evidence of hallucinations, delusions, obsessions or homicidal/suicidal ideation.  Musculoskeletal Global Assessment Spine, Ribs and Pelvis - no instability, subluxation or laxity. Right Upper Extremity - no instability, subluxation or laxity.  Lymphatic Head & Neck  General Head & Neck Lymphatics: Bilateral - Description - No Localized lymphadenopathy. Axillary  General Axillary Region: Bilateral - Description - No Localized lymphadenopathy. Femoral & Inguinal  Generalized Femoral & Inguinal Lymphatics: Left - Description - No Localized lymphadenopathy. Right - Description - No Localized lymphadenopathy.    Assessment & Plan [name] MD; 11/16/2017 4:07 PM) RIGHT INGUINAL HERNIA (K40.90) Impression: Obvious right inguinal hernia in obese but nonsmoking male. He is already had a left inguinal hernia repair with no strong evidence of recurrence.  I think he would benefit from diagnostic laparoscopy and repair of hernias found. Outpatient surgery. He is interested in proceeding. PREOP - ING HERNIA - ENCOUNTER FOR PREOPERATIVE EXAMINATION FOR GENERAL SURGICAL PROCEDURE (Z01.818) Current Plans You are being scheduled for surgery- Our schedulers will call you.  You should hear from our office's scheduling department within 5 working days about the location, date, and time of surgery. We try to make accommodations for patient's preferences in scheduling surgery, but sometimes the OR schedule or the  surgeon's schedule prevents [place] from making those accommodations.  If you have not heard from our office [phone]) in 5 working days, call the office and ask for your surgeon's nurse.  If you have other questions about your diagnosis, plan, or surgery, call the office and ask for your surgeon's nurse.  Written instructions provided The anatomy & physiology of the abdominal wall and pelvic floor was discussed. The pathophysiology of hernias in the inguinal and pelvic region was discussed. Natural history risks such as progressive enlargement, pain, incarceration, and strangulation was discussed. Contributors to complications such as smoking, obesity, diabetes, prior surgery, etc were discussed.  I feel the risks of no intervention will lead to serious problems that outweigh the operative risks; therefore, I recommended surgery to reduce and repair the hernia. I explained laparoscopic techniques with possible need for an open approach. I noted usual use of mesh to patch and/or buttress hernia repair  Risks such as bleeding, infection, abscess, need for further treatment, heart attack, death, and other risks were discussed. I noted a good likelihood this will help address the problem. Goals of post-operative recovery were discussed as well. Possibility that this will not correct all symptoms was explained. I stressed the importance of low-impact activity, aggressive pain control, avoiding constipation, & not pushing through pain to minimize risk of post-operative chronic pain or injury. Possibility of reherniation was discussed. We will work to minimize complications.  An educational handout further explaining the pathology & treatment options was given as well. Questions were answered. The patient expresses understanding & wishes to proceed with surgery.  Pt Education - Pamphlet Given - Laparoscopic Hernia Repair: discussed with patient and provided information. Pt Education - CCS  Pain Control (Ariele Vidrio) Pt Education - CCS Hernia Post-Op HCI (Clydell Sposito): discussed with patient and provided information. Pt Education - CCS Mesh education: discussed with patient and provided information.   [name], MD, FACS, MASCRS Gastrointestinal and Minimally Invasive Surgery    [address], [address] [place], [place] [address] [phone] Main / Paging [phone] Fax

## 2017-11-22 ENCOUNTER — Other Ambulatory Visit: Payer: Self-pay | Admitting: Family Medicine

## 2017-12-15 ENCOUNTER — Other Ambulatory Visit: Payer: Self-pay | Admitting: Family Medicine

## 2017-12-22 ENCOUNTER — Other Ambulatory Visit: Payer: Self-pay | Admitting: Family Medicine

## 2017-12-22 ENCOUNTER — Encounter: Payer: BLUE CROSS/BLUE SHIELD | Admitting: Family Medicine

## 2018-01-12 ENCOUNTER — Ambulatory Visit (INDEPENDENT_AMBULATORY_CARE_PROVIDER_SITE_OTHER): Payer: Managed Care, Other (non HMO) | Admitting: Family Medicine

## 2018-01-12 ENCOUNTER — Encounter: Payer: Self-pay | Admitting: Family Medicine

## 2018-01-12 VITALS — BP 133/85 | HR 73 | Temp 98.6°F | Ht 70.0 in | Wt 279.2 lb

## 2018-01-12 DIAGNOSIS — Z0000 Encounter for general adult medical examination without abnormal findings: Secondary | ICD-10-CM

## 2018-01-12 DIAGNOSIS — Z23 Encounter for immunization: Secondary | ICD-10-CM | POA: Diagnosis not present

## 2018-01-12 DIAGNOSIS — I10 Essential (primary) hypertension: Secondary | ICD-10-CM

## 2018-01-12 LAB — BAYER DCA HB A1C WAIVED: HB A1C (BAYER DCA - WAIVED): 5.2 % (ref <7.0)

## 2018-01-12 MED ORDER — ALLOPURINOL 100 MG PO TABS: ORAL_TABLET | ORAL | 2 refills | Status: DC

## 2018-01-12 MED ORDER — MELOXICAM 15 MG PO TABS: 15.0000 mg | ORAL_TABLET | Freq: Every day | ORAL | 1 refills | Status: DC

## 2018-01-12 MED ORDER — HYDROCHLOROTHIAZIDE 25 MG PO TABS: ORAL_TABLET | ORAL | 2 refills | Status: DC

## 2018-01-12 MED ORDER — METOPROLOL TARTRATE 25 MG PO TABS: 25.0000 mg | ORAL_TABLET | Freq: Two times a day (BID) | ORAL | 2 refills | Status: DC

## 2018-01-12 MED ORDER — AMLODIPINE BESYLATE 10 MG PO TABS: ORAL_TABLET | ORAL | 2 refills | Status: DC

## 2018-01-12 NOTE — Progress Notes (Signed)
Subjective:  Patient ID: Frank Herrera, male    DOB: 1965/04/03  Age: 52 y.o. MRN: [id number]  CC: Annual Exam   HPI FLORA RATZ presents for Complete Physical.  Depression screen [facility] 2/9 01/12/2018 10/21/2017 12/11/2016  Decreased Interest 0 0 0  Down, Depressed, Hopeless 0 0 0  PHQ - 2 Score 0 0 0    History Sebasthian has a past medical history of Hypertension.   He has a past surgical history that includes Spine surgery (2010) and Hernia repair (1990).   His family history includes Heart disease in his father; Hypertension in his brother, father, mother, and sister; Kidney disease in his father.He reports that he has never smoked. He has never used smokeless tobacco. He reports that he drinks alcohol. He reports that he does not use drugs.    ROS Review of Systems  Constitutional: Negative for activity change, fatigue and unexpected weight change.  HENT: Negative for congestion, ear pain, hearing loss, postnasal drip and trouble swallowing.   Eyes: Negative for pain and visual disturbance.  Respiratory: Negative for cough, chest tightness and shortness of breath.   Cardiovascular: Negative for chest pain, palpitations and leg swelling.  Gastrointestinal: Negative for abdominal distention, abdominal pain, blood in stool, constipation, diarrhea, nausea and vomiting.  Endocrine: Negative for cold intolerance, heat intolerance and polydipsia.  Genitourinary: Negative for difficulty urinating, dysuria, flank pain, frequency and urgency.  Musculoskeletal: Negative for arthralgias and joint swelling.  Skin: Negative for color change, rash and wound.  Neurological: Negative for dizziness, syncope, speech difficulty, weakness, light-headedness, numbness and headaches.  Hematological: Does not bruise/bleed easily.  Psychiatric/Behavioral: Negative for confusion, decreased concentration, dysphoric mood and sleep disturbance. The patient is not nervous/anxious.     Objective:  BP  133/85   Pulse 73   Temp 98.6 F (37 C) (Oral)   Ht '5\' 10"'$  (1.778 m)   Wt 279 lb 3.2 oz (126.6 kg)   BMI 40.06 kg/m   BP Readings from Last 3 Encounters:  01/12/18 133/85  10/21/17 125/76  06/22/17 137/84    Wt Readings from Last 3 Encounters:  01/12/18 279 lb 3.2 oz (126.6 kg)  10/21/17 277 lb 2 oz (125.7 kg)  06/22/17 282 lb (127.9 kg)     Physical Exam  Constitutional: He is oriented to person, place, and time. He appears well-developed and well-nourished.  HENT:  Head: Normocephalic and atraumatic.  Mouth/Throat: Oropharynx is clear and moist.  Eyes: Pupils are equal, round, and reactive to light. EOM are normal.  Neck: Normal range of motion. No tracheal deviation present. No thyromegaly present.  Cardiovascular: Normal rate, regular rhythm and normal heart sounds. Exam reveals no gallop and no friction rub.  No murmur heard. Pulmonary/Chest: Breath sounds normal. He has no wheezes. He has no rales.  Abdominal: Soft. Bowel sounds are normal. He exhibits no distension and no mass. There is no tenderness. Hernia confirmed negative in the right inguinal area and confirmed negative in the left inguinal area.  Genitourinary: Testes normal and penis normal.  Musculoskeletal: Normal range of motion. He exhibits no edema.  Lymphadenopathy:    He has no cervical adenopathy.  Neurological: He is alert and oriented to person, place, and time.  Skin: Skin is warm and dry.  Psychiatric: He has a normal mood and affect.      Assessment & Plan:   Dontavian was seen today for annual exam.  Diagnoses and all orders for this visit:  Well adult exam  Essential hypertension -     CBC with Differential/Platelet -     CMP14+EGFR -     Lipid panel -     Bayer DCA Hb A1c Waived -     VITAMIN D 25 Hydroxy (Vit-D Deficiency, Fractures)  Need for immunization against influenza -     Flu Vaccine QUAD 36+ mos IM  Other orders -     allopurinol (ZYLOPRIM) 100 MG tablet; TAKE 2  TABLETS (200 MG TOTAL) BY MOUTH DAILY. NEEDS TO BE SEEN FOR FURTHER REFILLS -     amLODipine (NORVASC) 10 MG tablet; TAKE 1 TABLET (10 MG TOTAL) BY MOUTH DAILY. -     hydrochlorothiazide (HYDRODIURIL) 25 MG tablet; TAKE 1 TABLET BY MOUTH EVERY DAY -     metoprolol tartrate (LOPRESSOR) 25 MG tablet; Take 1 tablet (25 mg total) by mouth 2 (two) times daily. -     meloxicam (MOBIC) 15 MG tablet; Take 1 tablet (15 mg total) by mouth daily.       I have changed Yandel Zeiner. Rochon's metoprolol tartrate. I am also having him maintain his allopurinol, amLODipine, hydrochlorothiazide, and meloxicam.  Allergies as of 01/12/2018   No Known Allergies     Medication List        Accurate as of 01/12/18 11:49 AM. Always use your most recent med list.          allopurinol 100 MG tablet Commonly known as:  ZYLOPRIM TAKE 2 TABLETS (200 MG TOTAL) BY MOUTH DAILY. NEEDS TO BE SEEN FOR FURTHER REFILLS   amLODipine 10 MG tablet Commonly known as:  NORVASC TAKE 1 TABLET (10 MG TOTAL) BY MOUTH DAILY.   hydrochlorothiazide 25 MG tablet Commonly known as:  HYDRODIURIL TAKE 1 TABLET BY MOUTH EVERY DAY   meloxicam 15 MG tablet Commonly known as:  MOBIC Take 1 tablet (15 mg total) by mouth daily.   metoprolol tartrate 25 MG tablet Commonly known as:  LOPRESSOR Take 1 tablet (25 mg total) by mouth 2 (two) times daily.        Follow-up: Return in about 6 months (around 07/13/2018).  [name], M.D.

## 2018-01-13 LAB — CMP14+EGFR
ALT: 27 IU/L (ref 0–44)
AST: 27 IU/L (ref 0–40)
Albumin/Globulin Ratio: 1.9 (ref 1.2–2.2)
Albumin: 4.6 g/dL (ref 3.5–5.5)
Alkaline Phosphatase: 106 IU/L (ref 39–117)
BUN/Creatinine Ratio: 15 (ref 9–20)
BUN: 14 mg/dL (ref 6–24)
Bilirubin Total: 0.5 mg/dL (ref 0.0–1.2)
CO2: 22 mmol/L (ref 20–29)
Calcium: 9.6 mg/dL (ref 8.7–10.2)
Chloride: 101 mmol/L (ref 96–106)
Creatinine, Ser: 0.93 mg/dL (ref 0.76–1.27)
GFR calc Af Amer: 110 mL/min/{1.73_m2} (ref >59)
GFR calc non Af Amer: 95 mL/min/{1.73_m2} (ref >59)
Globulin, Total: 2.4 g/dL (ref 1.5–4.5)
Glucose: 97 mg/dL (ref 65–99)
Potassium: 4 mmol/L (ref 3.5–5.2)
Sodium: 141 mmol/L (ref 134–144)
Total Protein: 7 g/dL (ref 6.0–8.5)

## 2018-01-13 LAB — CBC WITH DIFFERENTIAL/PLATELET
Basophils Absolute: 0 10*3/uL (ref 0.0–0.2)
Basos: 1 %
EOS (ABSOLUTE): 0.1 10*3/uL (ref 0.0–0.4)
Eos: 3 %
Hematocrit: 39.5 % (ref 37.5–51.0)
Hemoglobin: 12.8 g/dL — ABNORMAL LOW (ref 13.0–17.7)
Immature Grans (Abs): 0 10*3/uL (ref 0.0–0.1)
Immature Granulocytes: 0 %
Lymphocytes Absolute: 1.4 10*3/uL (ref 0.7–3.1)
Lymphs: 38 %
MCH: 29.4 pg (ref 26.6–33.0)
MCHC: 32.4 g/dL (ref 31.5–35.7)
MCV: 91 fL (ref 79–97)
Monocytes Absolute: 0.4 10*3/uL (ref 0.1–0.9)
Monocytes: 10 %
Neutrophils Absolute: 1.7 10*3/uL (ref 1.4–7.0)
Neutrophils: 48 %
Platelets: 261 10*3/uL (ref 150–450)
RBC: 4.35 x10E6/uL (ref 4.14–5.80)
RDW: 12.5 % (ref 12.3–15.4)
WBC: 3.6 10*3/uL (ref 3.4–10.8)

## 2018-01-13 LAB — LIPID PANEL
Chol/HDL Ratio: 3.4 ratio (ref 0.0–5.0)
Cholesterol, Total: 197 mg/dL (ref 100–199)
HDL: 58 mg/dL (ref >39)
LDL Calculated: 118 mg/dL — ABNORMAL HIGH (ref 0–99)
Triglycerides: 103 mg/dL (ref 0–149)
VLDL Cholesterol Cal: 21 mg/dL (ref 5–40)

## 2018-01-13 LAB — VITAMIN D 25 HYDROXY (VIT D DEFICIENCY, FRACTURES): Vit D, 25-Hydroxy: 24.8 ng/mL — ABNORMAL LOW (ref 30.0–100.0)

## 2018-01-13 MED ORDER — VITAMIN D (ERGOCALCIFEROL) 1.25 MG (50000 UNIT) PO CAPS: 50000.0000 [IU] | ORAL_CAPSULE | ORAL | 1 refills | Status: DC

## 2018-01-13 NOTE — Addendum Note (Signed)
Addended by: Lorelee Cover C on: 01/13/2018 10:40 AM   Modules accepted: Orders

## 2018-07-08 ENCOUNTER — Other Ambulatory Visit: Payer: Self-pay | Admitting: Family Medicine

## 2018-07-08 NOTE — Telephone Encounter (Signed)
Last Vit D 01/12/18  24.8

## 2018-07-13 ENCOUNTER — Ambulatory Visit: Payer: Managed Care, Other (non HMO) | Admitting: Family Medicine

## 2018-07-15 ENCOUNTER — Other Ambulatory Visit: Payer: Self-pay

## 2018-07-16 ENCOUNTER — Encounter: Payer: Self-pay | Admitting: Family Medicine

## 2018-07-16 ENCOUNTER — Ambulatory Visit (INDEPENDENT_AMBULATORY_CARE_PROVIDER_SITE_OTHER): Payer: Managed Care, Other (non HMO) | Admitting: Family Medicine

## 2018-07-16 DIAGNOSIS — M1000 Idiopathic gout, unspecified site: Secondary | ICD-10-CM

## 2018-07-16 DIAGNOSIS — I10 Essential (primary) hypertension: Secondary | ICD-10-CM | POA: Diagnosis not present

## 2018-07-16 MED ORDER — SILDENAFIL CITRATE 20 MG PO TABS: ORAL_TABLET | ORAL | 5 refills | Status: DC

## 2018-07-16 MED ORDER — ALLOPURINOL 100 MG PO TABS: ORAL_TABLET | ORAL | 2 refills | Status: DC

## 2018-07-16 MED ORDER — HYDROCHLOROTHIAZIDE 25 MG PO TABS: ORAL_TABLET | ORAL | 2 refills | Status: DC

## 2018-07-16 MED ORDER — AMLODIPINE BESYLATE 10 MG PO TABS: ORAL_TABLET | ORAL | 2 refills | Status: DC

## 2018-07-16 MED ORDER — NEBIVOLOL HCL 5 MG PO TABS: 5.0000 mg | ORAL_TABLET | Freq: Every day | ORAL | 2 refills | Status: DC

## 2018-07-16 NOTE — Progress Notes (Signed)
Subjective:    Patient ID: Frank SalvageSteven D Amer, male    DOB: 07/03/1965, 53 y.o.   MRN: [id number]   HPI: Frank Herrera is a 53 y.o. male presenting for follow-up of hypertension. Patient has no history of headache chest pain or shortness of breath or recent cough. Patient also denies symptoms of TIA such as numbness weakness lateralizing. Patient checks  blood pressure at home and has not had any elevated readings recently. Patient denies side effects from his medication. States taking it regularly.  Patient denies any attack from his gout recently.  He does continue to take the allopurinol as directed.  [facility] 2[facility] 01/12/2018 10/21/2017 12/11/2016 09/29/2016 09/11/2016  Decreased Interest 0 0 0 0 0  Down, Depressed, Hopeless 0 0 0 0 0  PHQ - 2 Score 0 0 0 0 0     Relevant past medical, surgical, family and social history reviewed and updated as indicated.  Interim medical history since our last visit reviewed. Allergies and medications reviewed and updated.  ROS:  Review of Systems  Constitutional: Negative.  Negative for appetite change.  HENT: Negative.   Eyes: Negative for visual disturbance.  Respiratory: Negative for cough and shortness of breath.   Cardiovascular: Negative for chest pain and leg swelling.  Gastrointestinal: Negative for abdominal pain, diarrhea, nausea and vomiting.  Genitourinary: Negative for difficulty urinating and dysuria.       Losing erection intermittently. Doesn't last.   Musculoskeletal: Negative for arthralgias and myalgias.  Skin: Negative for rash.  Neurological: Negative for headaches.  Psychiatric/Behavioral: Negative for sleep disturbance.     Social History   Tobacco Use  Smoking Status Never Smoker  Smokeless Tobacco Never Used       Objective:     Wt Readings from Last 3 Encounters:  01/12/18 279 lb 3.2 oz (126.6 kg)  10/21/17 277 lb 2 oz (125.7 kg)  06/22/17 282 lb (127.9 kg)     Exam deferred. Pt. Harboring  due to COVID 19. Phone visit performed.   Assessment & Plan:   1. Essential hypertension   2. Idiopathic gout, unspecified chronicity, unspecified site     Meds ordered this encounter  Medications  . nebivolol (BYSTOLIC) 5 MG tablet    Sig: Take 1 tablet (5 mg total) by mouth daily.    Dispense:  30 tablet    Refill:  2  . sildenafil (REVATIO) 20 MG tablet    Sig: Take 2-5 pills at once, orally, with each sexual encounter    Dispense:  50 tablet    Refill:  5  . allopurinol (ZYLOPRIM) 100 MG tablet    Sig: TAKE 2 TABLETS (200 MG TOTAL) BY MOUTH DAILY. NEEDS TO BE SEEN FOR FURTHER REFILLS    Dispense:  180 tablet    Refill:  2  . amLODipine (NORVASC) 10 MG tablet    Sig: TAKE 1 TABLET (10 MG TOTAL) BY MOUTH DAILY.for blood pressure    Dispense:  90 tablet    Refill:  2  . hydrochlorothiazide (HYDRODIURIL) 25 MG tablet    Sig: TAKE 1 TABLET BY MOUTH EVERY DAY    Dispense:  90 tablet    Refill:  2    No orders of the defined types were placed in this encounter.     Diagnoses and all orders for this visit:  Essential hypertension  Idiopathic gout, unspecified chronicity, unspecified site  Other orders -     nebivolol (BYSTOLIC) 5 MG tablet;  Take 1 tablet (5 mg total) by mouth daily. -     sildenafil (REVATIO) 20 MG tablet; Take 2-5 pills at once, orally, with each sexual encounter -     allopurinol (ZYLOPRIM) 100 MG tablet; TAKE 2 TABLETS (200 MG TOTAL) BY MOUTH DAILY. NEEDS TO BE SEEN FOR FURTHER REFILLS -     amLODipine (NORVASC) 10 MG tablet; TAKE 1 TABLET (10 MG TOTAL) BY MOUTH DAILY.for blood pressure -     hydrochlorothiazide (HYDRODIURIL) 25 MG tablet; TAKE 1 TABLET BY MOUTH EVERY DAY    Virtual Visit via telephone Note  I discussed the limitations, risks, security and privacy concerns of performing an evaluation and management service by telephone and the availability of in person appointments. The patient was identified with two identifiers. Pt.expressed  understanding and agreed to proceed. Pt. Is at home. Dr. Darlyn Read is in his office.  Follow Up Instructions:   I discussed the assessment and treatment plan with the patient. The patient was provided an opportunity to ask questions and all were answered. The patient agreed with the plan and demonstrated an understanding of the instructions.   The patient was advised to call back or seek an in-person evaluation if the symptoms worsen or if the condition fails to improve as anticipated.   Total minutes including chart review and phone contact time: 18   Follow up plan: Return in about 3 months (around 10/16/2018).  Mechele Claude, MD [facility]

## 2019-02-22 ENCOUNTER — Other Ambulatory Visit: Payer: Self-pay | Admitting: Family Medicine

## 2019-04-12 ENCOUNTER — Other Ambulatory Visit: Payer: Self-pay

## 2019-04-13 ENCOUNTER — Other Ambulatory Visit: Payer: Self-pay

## 2019-04-13 ENCOUNTER — Encounter: Payer: Self-pay | Admitting: Family Medicine

## 2019-04-13 ENCOUNTER — Ambulatory Visit (INDEPENDENT_AMBULATORY_CARE_PROVIDER_SITE_OTHER): Payer: No Typology Code available for payment source | Admitting: Family Medicine

## 2019-04-13 VITALS — BP 138/88 | HR 72 | Temp 99.5°F | Ht 70.0 in | Wt 278.0 lb

## 2019-04-13 DIAGNOSIS — Z23 Encounter for immunization: Secondary | ICD-10-CM

## 2019-04-13 DIAGNOSIS — N529 Male erectile dysfunction, unspecified: Secondary | ICD-10-CM | POA: Diagnosis not present

## 2019-04-13 DIAGNOSIS — I10 Essential (primary) hypertension: Secondary | ICD-10-CM | POA: Diagnosis not present

## 2019-04-13 DIAGNOSIS — M1000 Idiopathic gout, unspecified site: Secondary | ICD-10-CM | POA: Diagnosis not present

## 2019-04-13 DIAGNOSIS — E559 Vitamin D deficiency, unspecified: Secondary | ICD-10-CM

## 2019-04-13 MED ORDER — HYDROCHLOROTHIAZIDE 25 MG PO TABS: ORAL_TABLET | ORAL | 2 refills | Status: DC

## 2019-04-13 MED ORDER — AMLODIPINE BESYLATE 10 MG PO TABS: ORAL_TABLET | ORAL | 2 refills | Status: DC

## 2019-04-13 MED ORDER — SILDENAFIL CITRATE 20 MG PO TABS: ORAL_TABLET | ORAL | 5 refills | Status: DC

## 2019-04-13 MED ORDER — VITAMIN D (ERGOCALCIFEROL) 1.25 MG (50000 UNIT) PO CAPS: 50000.0000 [IU] | ORAL_CAPSULE | ORAL | 0 refills | Status: DC

## 2019-04-13 MED ORDER — NEBIVOLOL HCL 5 MG PO TABS: 5.0000 mg | ORAL_TABLET | Freq: Every day | ORAL | 2 refills | Status: DC

## 2019-04-13 MED ORDER — ALLOPURINOL 100 MG PO TABS: ORAL_TABLET | ORAL | 2 refills | Status: DC

## 2019-04-13 NOTE — Progress Notes (Signed)
Subjective:  Patient ID: Frank Herrera, male    DOB: [birth year]  Age: 54 y.o. MRN: [id number]  CC: Follow-up (6 month)   HPI CALEY VOLKERT presents for  follow-up of hypertension. Patient has no history of headache chest pain or shortness of breath or recent cough. Patient also denies symptoms of TIA such as focal numbness or weakness. Patient denies side effects from medication. States taking it regularly. He never got the bystolic, but he would like to make the switch from metoprolol. E.D.  Still occurring but sildenafil helps.   No flares of gout Taking allopurinol. Denies joint pain.   Pt. Not working on weight loss. Says he'll look sick if he loses down to ideal weight.   History Ormand has a past medical history of Hypertension.   He has a past surgical history that includes Spine surgery (2010) and Hernia repair (1990).   His family history includes Heart disease in his father; Hypertension in his brother, father, mother, and sister; Kidney disease in his father.He reports that he has never smoked. He has never used smokeless tobacco. He reports current alcohol use. He reports that he does not use drugs.  No current outpatient medications on file prior to visit.   No current facility-administered medications on file prior to visit.    ROS Review of Systems  Constitutional: Negative.   HENT: Negative.   Eyes: Negative for visual disturbance.  Respiratory: Negative for cough and shortness of breath.   Cardiovascular: Negative for chest pain and leg swelling.  Gastrointestinal: Negative for abdominal pain, diarrhea, nausea and vomiting.  Genitourinary: Negative for difficulty urinating.  Musculoskeletal: Negative for arthralgias and myalgias.  Skin: Negative for rash.  Neurological: Negative for headaches.  Psychiatric/Behavioral: Negative for sleep disturbance.    Objective:  BP 138/88   Pulse 72   Temp 99.5 F (37.5 C) (Temporal)   Ht '5\' 10"'$  (1.778 m)   Wt  278 lb (126.1 kg)   BMI 39.89 kg/m   BP Readings from Last 3 Encounters:  04/13/19 138/88  01/12/18 133/85  10/21/17 125/76    Wt Readings from Last 3 Encounters:  04/13/19 278 lb (126.1 kg)  01/12/18 279 lb 3.2 oz (126.6 kg)  10/21/17 277 lb 2 oz (125.7 kg)     Physical Exam Vitals reviewed.  Constitutional:      Appearance: He is well-developed.  HENT:     Head: Normocephalic and atraumatic.     Right Ear: Tympanic membrane and external ear normal. No decreased hearing noted.     Left Ear: Tympanic membrane and external ear normal. No decreased hearing noted.     Mouth/Throat:     Pharynx: No oropharyngeal exudate or posterior oropharyngeal erythema.  Eyes:     Pupils: Pupils are equal, round, and reactive to light.  Cardiovascular:     Rate and Rhythm: Normal rate and regular rhythm.     Heart sounds: No murmur.  Pulmonary:     Effort: No respiratory distress.     Breath sounds: Normal breath sounds.  Abdominal:     General: Bowel sounds are normal.     Palpations: Abdomen is soft. There is no mass.     Tenderness: There is no abdominal tenderness.  Musculoskeletal:     Cervical back: Normal range of motion and neck supple.       Assessment & Plan:   Lopez was seen today for follow-up.  Diagnoses and all orders for this visit:  Essential hypertension -  CBC with Differential/Platelet -     CMP14+EGFR -     amLODipine (NORVASC) 10 MG tablet; TAKE 1 TABLET (10 MG TOTAL) BY MOUTH DAILY.for blood pressure -     hydrochlorothiazide (HYDRODIURIL) 25 MG tablet; TAKE 1 TABLET BY MOUTH EVERY DAY -     nebivolol (BYSTOLIC) 5 MG tablet; Take 1 tablet (5 mg total) by mouth daily.  Severe obesity (BMI >= 40) (HCC) -     CBC with Differential/Platelet -     CMP14+EGFR  Idiopathic gout, unspecified chronicity, unspecified site -     CBC with Differential/Platelet -     CMP14+EGFR -     Uric acid -     allopurinol (ZYLOPRIM) 100 MG tablet; TAKE 2 TABLETS (200  MG TOTAL) BY MOUTH DAILY. NEEDS TO BE SEEN FOR FURTHER REFILLS  Erectile dysfunction, unspecified erectile dysfunction type -     CBC with Differential/Platelet -     CMP14+EGFR -     sildenafil (REVATIO) 20 MG tablet; Take 2-5 pills at once, orally, with each sexual encounter  Vitamin D deficiency -     CBC with Differential/Platelet -     CMP14+EGFR -     Vitamin D, Ergocalciferol, (DRISDOL) 1.25 MG (50000 UNIT) CAPS capsule; Take 1 capsule (50,000 Units total) by mouth every 7 (seven) days.  Need for immunization against influenza -     Flu Vaccine QUAD 36+ mos IM   Allergies as of 04/13/2019   No Known Allergies     Medication List       Accurate as of April 13, 2019  6:45 PM. If you have any questions, ask your nurse or doctor.        allopurinol 100 MG tablet Commonly known as: ZYLOPRIM TAKE 2 TABLETS (200 MG TOTAL) BY MOUTH DAILY. NEEDS TO BE SEEN FOR FURTHER REFILLS   amLODipine 10 MG tablet Commonly known as: NORVASC TAKE 1 TABLET (10 MG TOTAL) BY MOUTH DAILY.for blood pressure   hydrochlorothiazide 25 MG tablet Commonly known as: HYDRODIURIL TAKE 1 TABLET BY MOUTH EVERY DAY   nebivolol 5 MG tablet Commonly known as: Bystolic Take 1 tablet (5 mg total) by mouth daily.   sildenafil 20 MG tablet Commonly known as: REVATIO Take 2-5 pills at once, orally, with each sexual encounter   Vitamin D (Ergocalciferol) 1.25 MG (50000 UNIT) Caps capsule Commonly known as: DRISDOL Take 1 capsule (50,000 Units total) by mouth every 7 (seven) days.       Meds ordered this encounter  Medications  . allopurinol (ZYLOPRIM) 100 MG tablet    Sig: TAKE 2 TABLETS (200 MG TOTAL) BY MOUTH DAILY. NEEDS TO BE SEEN FOR FURTHER REFILLS    Dispense:  180 tablet    Refill:  2  . amLODipine (NORVASC) 10 MG tablet    Sig: TAKE 1 TABLET (10 MG TOTAL) BY MOUTH DAILY.for blood pressure    Dispense:  90 tablet    Refill:  2  . hydrochlorothiazide (HYDRODIURIL) 25 MG tablet    Sig:  TAKE 1 TABLET BY MOUTH EVERY DAY    Dispense:  90 tablet    Refill:  2  . nebivolol (BYSTOLIC) 5 MG tablet    Sig: Take 1 tablet (5 mg total) by mouth daily.    Dispense:  30 tablet    Refill:  2  . sildenafil (REVATIO) 20 MG tablet    Sig: Take 2-5 pills at once, orally, with each sexual encounter    Dispense:  50 tablet    Refill:  5  . Vitamin D, Ergocalciferol, (DRISDOL) 1.25 MG (50000 UNIT) CAPS capsule    Sig: Take 1 capsule (50,000 Units total) by mouth every 7 (seven) days.    Dispense:  13 capsule    Refill:  0    Patient was counseled regarding weight loss.  It is very critical for his long-term health that he reduce his weight.  I recommended that he follow a high-protein diet and do some resistance training along with cardio and that should help him maintain muscle mass so he will look sickly.  Additionally even if he does not get to his ideal weight every few pounds will help with his heart attack stroke and diabetes risk as well as assist with his treatment for blood pressure.  He will go ahead and make the switch over to the Bystolic today and discontinue the metoprolol.  He can still use the Revatio as needed but hopefully it will not be necessary once he makes that switch.  Blood pressure stable so otherwise his medicines will remain the same.  Should ED become more more of a problem will consider also finding a substitute for the hydrochlorothiazide.  Follow-up: Return in about 6 months (around 10/11/2019).  [name], M.D.

## 2019-04-14 LAB — CBC WITH DIFFERENTIAL/PLATELET
Basophils Absolute: 0 10*3/uL (ref 0.0–0.2)
Basos: 1 %
EOS (ABSOLUTE): 0.1 10*3/uL (ref 0.0–0.4)
Eos: 1 %
Hematocrit: 40 % (ref 37.5–51.0)
Hemoglobin: 13.2 g/dL (ref 13.0–17.7)
Immature Grans (Abs): 0.1 10*3/uL (ref 0.0–0.1)
Immature Granulocytes: 1 %
Lymphocytes Absolute: 2.2 10*3/uL (ref 0.7–3.1)
Lymphs: 36 %
MCH: 29.9 pg (ref 26.6–33.0)
MCHC: 33 g/dL (ref 31.5–35.7)
MCV: 91 fL (ref 79–97)
Monocytes Absolute: 0.5 10*3/uL (ref 0.1–0.9)
Monocytes: 8 %
Neutrophils Absolute: 3.2 10*3/uL (ref 1.4–7.0)
Neutrophils: 53 %
Platelets: 353 10*3/uL (ref 150–450)
RBC: 4.42 x10E6/uL (ref 4.14–5.80)
RDW: 12.5 % (ref 11.6–15.4)
WBC: 6 10*3/uL (ref 3.4–10.8)

## 2019-04-14 LAB — CMP14+EGFR
ALT: 37 IU/L (ref 0–44)
AST: 30 IU/L (ref 0–40)
Albumin/Globulin Ratio: 1.5 (ref 1.2–2.2)
Albumin: 4.6 g/dL (ref 3.8–4.9)
Alkaline Phosphatase: 106 IU/L (ref 39–117)
BUN/Creatinine Ratio: 14 (ref 9–20)
BUN: 12 mg/dL (ref 6–24)
Bilirubin Total: 0.6 mg/dL (ref 0.0–1.2)
CO2: 25 mmol/L (ref 20–29)
Calcium: 10 mg/dL (ref 8.7–10.2)
Chloride: 100 mmol/L (ref 96–106)
Creatinine, Ser: 0.85 mg/dL (ref 0.76–1.27)
GFR calc Af Amer: 117 mL/min/{1.73_m2} (ref >59)
GFR calc non Af Amer: 101 mL/min/{1.73_m2} (ref >59)
Globulin, Total: 3.1 g/dL (ref 1.5–4.5)
Glucose: 93 mg/dL (ref 65–99)
Potassium: 4.1 mmol/L (ref 3.5–5.2)
Sodium: 139 mmol/L (ref 134–144)
Total Protein: 7.7 g/dL (ref 6.0–8.5)

## 2019-04-14 LAB — URIC ACID: Uric Acid: 6.3 mg/dL (ref 3.8–8.4)

## 2019-04-14 NOTE — Progress Notes (Signed)
Hello Aitan,  Your lab result is normal and/or stable.Some minor variations that are not significant are commonly marked abnormal, but do not represent any medical problem for you.  Best regards, Tymere Depuy, M.D.

## 2019-06-21 ENCOUNTER — Telehealth: Payer: Self-pay | Admitting: Family Medicine

## 2019-07-01 ENCOUNTER — Encounter: Payer: Self-pay | Admitting: Family Medicine

## 2019-07-01 ENCOUNTER — Ambulatory Visit (INDEPENDENT_AMBULATORY_CARE_PROVIDER_SITE_OTHER): Payer: No Typology Code available for payment source | Admitting: Family Medicine

## 2019-07-01 DIAGNOSIS — R3989 Other symptoms and signs involving the genitourinary system: Secondary | ICD-10-CM

## 2019-07-01 DIAGNOSIS — R310 Gross hematuria: Secondary | ICD-10-CM

## 2019-07-01 LAB — URINALYSIS, COMPLETE
Bilirubin, UA: NEGATIVE
Glucose, UA: NEGATIVE
Ketones, UA: NEGATIVE
Nitrite, UA: NEGATIVE
Protein,UA: NEGATIVE
Specific Gravity, UA: 1.025 (ref 1.005–1.030)
Urobilinogen, Ur: 0.2 mg/dL (ref 0.2–1.0)
pH, UA: 6 (ref 5.0–7.5)

## 2019-07-01 LAB — MICROSCOPIC EXAMINATION
Bacteria, UA: NONE SEEN
RBC, Urine: >30 /hpf — AB (ref 0–2)
Renal Epithel, UA: NONE SEEN /hpf

## 2019-07-01 MED ORDER — CIPROFLOXACIN HCL 500 MG PO TABS: 500.0000 mg | ORAL_TABLET | Freq: Two times a day (BID) | ORAL | 0 refills | Status: AC

## 2019-07-01 NOTE — Progress Notes (Signed)
Virtual Visit via Telephone Note  I connected with Frank Herrera on 07/01/19 at 2:00 PM by telephone and verified that I am speaking with the correct person using two identifiers. Frank Herrera is currently located at home and nobody is currently with him during this visit. The provider, Loman Brooklyn, FNP is located in their home at time of visit.  I discussed the limitations, risks, security and privacy concerns of performing an evaluation and management service by telephone and the availability of in person appointments. I also discussed with the patient that there may be a patient responsible charge related to this service. The patient expressed understanding and agreed to proceed.  Subjective: PCP: [name], MD  Chief Complaint  Patient presents with  . Hematuria   Patient reports he noticed blood in his urine that started last night.  There has still been a little bit today.  He is also having burning when he urinates.  Denies any pain in his back or groin area.  No fever.   ROS: Per HPI  Current Outpatient Medications:  .  allopurinol (ZYLOPRIM) 100 MG tablet, TAKE 2 TABLETS (200 MG TOTAL) BY MOUTH DAILY. NEEDS TO BE SEEN FOR FURTHER REFILLS, Disp: 180 tablet, Rfl: 2 .  amLODipine (NORVASC) 10 MG tablet, TAKE 1 TABLET (10 MG TOTAL) BY MOUTH DAILY.for blood pressure, Disp: 90 tablet, Rfl: 2 .  hydrochlorothiazide (HYDRODIURIL) 25 MG tablet, TAKE 1 TABLET BY MOUTH EVERY DAY, Disp: 90 tablet, Rfl: 2 .  nebivolol (BYSTOLIC) 5 MG tablet, Take 1 tablet (5 mg total) by mouth daily., Disp: 30 tablet, Rfl: 2 .  sildenafil (REVATIO) 20 MG tablet, Take 2-5 pills at once, orally, with each sexual encounter, Disp: 50 tablet, Rfl: 5 .  Vitamin D, Ergocalciferol, (DRISDOL) 1.25 MG (50000 UNIT) CAPS capsule, Take 1 capsule (50,000 Units total) by mouth every 7 (seven) days., Disp: 13 capsule, Rfl: 0  No Known Allergies Past Medical History:  Diagnosis Date  . Hypertension      Observations/Objective: A&O  No respiratory distress or wheezing audible over the phone Mood, judgement, and thought processes all WNL   Assessment and Plan: 1. Gross hematuria - Asked patient to leave a urine specimen today to send off for culture.  Also asked him to leave another urine specimen a week after he completes his antibiotic so that we can ensure there is no further blood in his urine. - Urine Culture - Urinalysis, Complete - Urinalysis, Complete; Future  2. Suspected UTI - Education provided on UTIs. Encouraged adequate hydration.  - ciprofloxacin (CIPRO) 500 MG tablet; Take 1 tablet (500 mg total) by mouth 2 (two) times daily for 7 days.  Dispense: 14 tablet; Refill: 0   Follow Up Instructions:  I discussed the assessment and treatment plan with the patient. The patient was provided an opportunity to ask questions and all were answered. The patient agreed with the plan and demonstrated an understanding of the instructions.   The patient was advised to call back or seek an in-person evaluation if the symptoms worsen or if the condition fails to improve as anticipated.  The above assessment and management plan was discussed with the patient. The patient verbalized understanding of and has agreed to the management plan. Patient is aware to call the clinic if symptoms persist or worsen. Patient is aware when to return to the clinic for a follow-up visit. Patient educated on when it is appropriate to go to the emergency department.  Time call ended: 2:07 PM  I provided 9 minutes of non-face-to-face time during this encounter.  [name], MSN, APRN, FNP-C [facility] 07/01/19

## 2019-07-01 NOTE — Patient Instructions (Signed)
Urinary Tract Infection, Adult A urinary tract infection (UTI) is an infection of any part of the urinary tract. The urinary tract includes:  The kidneys.  The ureters.  The bladder.  The urethra. These organs make, store, and get rid of pee (urine) in the body. What are the causes? This is caused by germs (bacteria) in your genital area. These germs grow and cause swelling (inflammation) of your urinary tract. What increases the risk? You are more likely to develop this condition if:  You have a small, thin tube (catheter) to drain pee.  You cannot control when you pee or poop (incontinence).  You are male, and: ? You use these methods to prevent pregnancy:  A medicine that kills sperm (spermicide).  A device that blocks sperm (diaphragm). ? You have low levels of a male hormone (estrogen). ? You are pregnant.  You have genes that add to your risk.  You are sexually active.  You take antibiotic medicines.  You have trouble peeing because of: ? A prostate that is bigger than normal, if you are male. ? A blockage in the part of your body that drains pee from the bladder (urethra). ? A kidney stone. ? A nerve condition that affects your bladder (neurogenic bladder). ? Not getting enough to drink. ? Not peeing often enough.  You have other conditions, such as: ? Diabetes. ? A weak disease-fighting system (immune system). ? Sickle cell disease. ? Gout. ? Injury of the spine. What are the signs or symptoms? Symptoms of this condition include:  Needing to pee right away (urgently).  Peeing often.  Peeing small amounts often.  Pain or burning when peeing.  Blood in the pee.  Pee that smells bad or not like normal.  Trouble peeing.  Pee that is cloudy.  Fluid coming from the vagina, if you are male.  Pain in the belly or lower back. Other symptoms include:  Throwing up (vomiting).  No urge to eat.  Feeling mixed up (confused).  Being tired  and grouchy (irritable).  A fever.  Watery poop (diarrhea). How is this treated? This condition may be treated with:  Antibiotic medicine.  Other medicines.  Drinking enough water. Follow these instructions at home:  Medicines  Take over-the-counter and prescription medicines only as told by your doctor.  If you were prescribed an antibiotic medicine, take it as told by your doctor. Do not stop taking it even if you start to feel better. General instructions  Make sure you: ? Pee until your bladder is empty. ? Do not hold pee for a long time. ? Empty your bladder after sex. ? Wipe from front to back after pooping if you are a male. Use each tissue one time when you wipe.  Drink enough fluid to keep your pee pale yellow.  Keep all follow-up visits as told by your doctor. This is important. Contact a doctor if:  You do not get better after 1-2 days.  Your symptoms go away and then come back. Get help right away if:  You have very bad back pain.  You have very bad pain in your lower belly.  You have a fever.  You are sick to your stomach (nauseous).  You are throwing up. Summary  A urinary tract infection (UTI) is an infection of any part of the urinary tract.  This condition is caused by germs in your genital area.  There are many risk factors for a UTI. These include having a small, thin   tube to drain pee and not being able to control when you pee or poop.  Treatment includes antibiotic medicines for germs.  Drink enough fluid to keep your pee pale yellow. This information is not intended to replace advice given to you by your health care provider. Make sure you discuss any questions you have with your health care provider. Document Revised: 02/11/2018 Document Reviewed: 09/03/2017 Elsevier Patient Education  2020 [employer]  

## 2019-07-04 LAB — URINE CULTURE

## 2019-08-09 ENCOUNTER — Encounter: Payer: Self-pay | Admitting: Family Medicine

## 2019-08-09 ENCOUNTER — Ambulatory Visit (INDEPENDENT_AMBULATORY_CARE_PROVIDER_SITE_OTHER): Payer: No Typology Code available for payment source | Admitting: Family Medicine

## 2019-08-09 ENCOUNTER — Other Ambulatory Visit: Payer: Self-pay

## 2019-08-09 VITALS — BP 142/92 | HR 82 | Temp 98.4°F | Ht 70.0 in | Wt 287.4 lb

## 2019-08-09 DIAGNOSIS — S70262A Insect bite (nonvenomous), left hip, initial encounter: Secondary | ICD-10-CM

## 2019-08-09 DIAGNOSIS — R21 Rash and other nonspecific skin eruption: Secondary | ICD-10-CM | POA: Diagnosis not present

## 2019-08-09 DIAGNOSIS — W57XXXA Bitten or stung by nonvenomous insect and other nonvenomous arthropods, initial encounter: Secondary | ICD-10-CM

## 2019-08-09 MED ORDER — DOXYCYCLINE HYCLATE 100 MG PO TABS: 100.0000 mg | ORAL_TABLET | Freq: Two times a day (BID) | ORAL | 0 refills | Status: AC

## 2019-08-09 NOTE — Patient Instructions (Signed)

## 2019-08-09 NOTE — Progress Notes (Signed)
Assessment & Plan:  1-2. Tick bite of left hip, initial encounter/Rash - Education provided on tick bites. Patient to return for labs in 1 week when it has been 2 weeks since removing the tick.  - doxycycline (VIBRA-TABS) 100 MG tablet; Take 1 tablet (100 mg total) by mouth 2 (two) times daily for 21 days. 1 po bid  Dispense: 42 tablet; Refill: 0 - Lyme Ab/Western Blot Reflex; Future - Rocky mtn spotted fvr abs pnl(IgG+IgM); Future   Follow up plan: Return if symptoms worsen or fail to improve.  Frank Boston, MSN, APRN, FNP-C [facility]  Subjective:   Patient ID: OTHER Frank Herrera, male    DOB: 02-20-1966, 54 y.o.   MRN: [id number]  HPI: Frank Herrera is a 54 y.o. male presenting on 08/09/2019 for Tick Removal (Left hip x 1 week.  Patient states it is red and swollen)  Patient removed a tick from his left hip 1 week ago. It has since been red and swollen. He denies HA, fever, joint pains, and fatigue.   ROS: Negative unless specifically indicated above in HPI.   Relevant past medical history reviewed and updated as indicated.   Allergies and medications reviewed and updated.   Current Outpatient Medications:  .  allopurinol (ZYLOPRIM) 100 MG tablet, TAKE 2 TABLETS (200 MG TOTAL) BY MOUTH DAILY. NEEDS TO BE SEEN FOR FURTHER REFILLS, Disp: 180 tablet, Rfl: 2 .  amLODipine (NORVASC) 10 MG tablet, TAKE 1 TABLET (10 MG TOTAL) BY MOUTH DAILY.for blood pressure, Disp: 90 tablet, Rfl: 2 .  hydrochlorothiazide (HYDRODIURIL) 25 MG tablet, TAKE 1 TABLET BY MOUTH EVERY DAY, Disp: 90 tablet, Rfl: 2 .  nebivolol (BYSTOLIC) 5 MG tablet, Take 1 tablet (5 mg total) by mouth daily., Disp: 30 tablet, Rfl: 2 .  sildenafil (REVATIO) 20 MG tablet, Take 2-5 pills at once, orally, with each sexual encounter, Disp: 50 tablet, Rfl: 5 .  Vitamin D, Ergocalciferol, (DRISDOL) 1.25 MG (50000 UNIT) CAPS capsule, Take 1 capsule (50,000 Units total) by mouth every 7 (seven) days., Disp: 13  capsule, Rfl: 0 .  doxycycline (VIBRA-TABS) 100 MG tablet, Take 1 tablet (100 mg total) by mouth 2 (two) times daily for 21 days. 1 po bid, Disp: 42 tablet, Rfl: 0  No Known Allergies  Objective:   BP (!) 142/92   Pulse 82   Temp 98.4 F (36.9 C) (Temporal)   Ht 5\' 10"  (1.778 m)   Wt 287 lb 6.4 oz (130.4 kg)   SpO2 96%   BMI 41.24 kg/m    Physical Exam Vitals reviewed.  Constitutional:      General: He is not in acute distress.    Appearance: Normal appearance. He is morbidly obese. He is not ill-appearing, toxic-appearing or diaphoretic.  HENT:     Head: Normocephalic and atraumatic.  Eyes:     General: No scleral icterus.       Right eye: No discharge.        Left eye: No discharge.     Conjunctiva/sclera: Conjunctivae normal.  Cardiovascular:     Rate and Rhythm: Normal rate.  Pulmonary:     Effort: Pulmonary effort is normal. No respiratory distress.  Musculoskeletal:        General: Normal range of motion.     Cervical back: Normal range of motion.  Skin:    General: Skin is warm and dry.     Findings: Rash (left hip (pictured below)) present.  Neurological:  Mental Status: He is alert and oriented to person, place, and time. Mental status is at baseline.  Psychiatric:        Mood and Affect: Mood normal.        Behavior: Behavior normal.        Thought Content: Thought content normal.        Judgment: Judgment normal.

## 2019-08-11 ENCOUNTER — Encounter: Payer: Self-pay | Admitting: *Deleted

## 2019-08-22 ENCOUNTER — Other Ambulatory Visit: Payer: Self-pay | Admitting: Family Medicine

## 2019-08-22 DIAGNOSIS — W57XXXA Bitten or stung by nonvenomous insect and other nonvenomous arthropods, initial encounter: Secondary | ICD-10-CM

## 2019-08-22 DIAGNOSIS — S70262A Insect bite (nonvenomous), left hip, initial encounter: Secondary | ICD-10-CM

## 2019-08-22 DIAGNOSIS — R21 Rash and other nonspecific skin eruption: Secondary | ICD-10-CM

## 2019-10-06 ENCOUNTER — Other Ambulatory Visit: Payer: Self-pay

## 2019-10-06 ENCOUNTER — Ambulatory Visit (INDEPENDENT_AMBULATORY_CARE_PROVIDER_SITE_OTHER): Payer: No Typology Code available for payment source | Admitting: Family Medicine

## 2019-10-06 ENCOUNTER — Encounter: Payer: Self-pay | Admitting: Family Medicine

## 2019-10-06 VITALS — BP 136/83 | HR 82 | Temp 98.7°F | Ht 70.0 in | Wt 284.0 lb

## 2019-10-06 DIAGNOSIS — M1000 Idiopathic gout, unspecified site: Secondary | ICD-10-CM | POA: Diagnosis not present

## 2019-10-06 DIAGNOSIS — I10 Essential (primary) hypertension: Secondary | ICD-10-CM

## 2019-10-06 DIAGNOSIS — N529 Male erectile dysfunction, unspecified: Secondary | ICD-10-CM

## 2019-10-06 MED ORDER — SILDENAFIL CITRATE 20 MG PO TABS: ORAL_TABLET | ORAL | 5 refills | Status: DC

## 2019-10-06 MED ORDER — ALLOPURINOL 100 MG PO TABS: ORAL_TABLET | ORAL | 2 refills | Status: DC

## 2019-10-06 MED ORDER — AMLODIPINE BESYLATE 10 MG PO TABS: ORAL_TABLET | ORAL | 2 refills | Status: DC

## 2019-10-06 MED ORDER — CARVEDILOL 6.25 MG PO TABS: 6.2500 mg | ORAL_TABLET | Freq: Two times a day (BID) | ORAL | 1 refills | Status: DC

## 2019-10-06 MED ORDER — CYCLOBENZAPRINE HCL 10 MG PO TABS
10.0000 mg | ORAL_TABLET | Freq: Every day | ORAL | 2 refills | Status: DC
Start: 2019-10-06 — End: 2021-03-18

## 2019-10-06 MED ORDER — HYDROCHLOROTHIAZIDE 25 MG PO TABS: ORAL_TABLET | ORAL | 2 refills | Status: DC

## 2019-10-06 NOTE — Progress Notes (Signed)
Subjective:  Patient ID: Frank Herrera, male    DOB: 22-May-1965  Age: 54 y.o. MRN: [id number]  CC: Follow-up (6 month)   HPI ROMAN DUBUC presents for  follow-up of hypertension. Patient has no history of headache chest pain or shortness of breath or recent cough. Patient also denies symptoms of TIA such as focal numbness or weakness. Patient denies side effects from medication. States taking it regularly.Bystolic too expensive. Recently laid off.   Having premature ejaculation with sildenafil. Using 2-3 per episode.  He states it makes him too sensitive.  History Domonic has a past medical history of Hypertension.   He has a past surgical history that includes Spine surgery (2010) and Hernia repair (1990).   His family history includes Heart disease in his father; Hypertension in his brother, father, mother, and sister; Kidney disease in his father.He reports that he has never smoked. He has never used smokeless tobacco. He reports current alcohol use. He reports that he does not use drugs.  Current Outpatient Medications on File Prior to Visit  Medication Sig Dispense Refill  . Vitamin D, Ergocalciferol, (DRISDOL) 1.25 MG (50000 UNIT) CAPS capsule Take 1 capsule (50,000 Units total) by mouth every 7 (seven) days. 13 capsule 0   No current facility-administered medications on file prior to visit.    ROS Review of Systems  Constitutional: Negative.   HENT: Negative.   Eyes: Negative for visual disturbance.  Respiratory: Negative for cough and shortness of breath.   Cardiovascular: Negative for chest pain and leg swelling.  Gastrointestinal: Negative for abdominal pain, diarrhea, nausea and vomiting.  Genitourinary: Negative for difficulty urinating.  Musculoskeletal: Negative for arthralgias and myalgias.  Skin: Negative for rash.  Neurological: Negative for headaches.  Psychiatric/Behavioral: Negative for sleep disturbance.    Objective:  BP (!) 136/83   Pulse 82    Temp 98.7 F (37.1 C) (Temporal)   Ht '5\' 10"'$  (1.778 m)   Wt (!) 284 lb (128.8 kg)   BMI 40.75 kg/m   BP Readings from Last 3 Encounters:  10/06/19 (!) 136/83  08/09/19 (!) 142/92  04/13/19 138/88    Wt Readings from Last 3 Encounters:  10/06/19 (!) 284 lb (128.8 kg)  08/09/19 287 lb 6.4 oz (130.4 kg)  04/13/19 278 lb (126.1 kg)     Physical Exam Constitutional:      General: He is not in acute distress.    Appearance: He is well-developed.  HENT:     Head: Normocephalic and atraumatic.     Right Ear: External ear normal.     Left Ear: External ear normal.     Nose: Nose normal.  Eyes:     Conjunctiva/sclera: Conjunctivae normal.     Pupils: Pupils are equal, round, and reactive to light.  Cardiovascular:     Rate and Rhythm: Normal rate and regular rhythm.     Heart sounds: Normal heart sounds. No murmur heard.   Pulmonary:     Effort: Pulmonary effort is normal. No respiratory distress.     Breath sounds: Normal breath sounds. No wheezing or rales.  Abdominal:     Palpations: Abdomen is soft.     Tenderness: There is no abdominal tenderness.  Musculoskeletal:        General: Normal range of motion.     Cervical back: Normal range of motion and neck supple.  Skin:    General: Skin is warm and dry.  Neurological:     Mental Status: He is  alert and oriented to person, place, and time.     Deep Tendon Reflexes: Reflexes are normal and symmetric.  Psychiatric:        Behavior: Behavior normal.        Thought Content: Thought content normal.        Judgment: Judgment normal.       Assessment & Plan:   Ebony was seen today for follow-up.  Diagnoses and all orders for this visit:  Essential hypertension -     amLODipine (NORVASC) 10 MG tablet; TAKE 1 TABLET (10 MG TOTAL) BY MOUTH DAILY.for blood pressure -     hydrochlorothiazide (HYDRODIURIL) 25 MG tablet; TAKE 1 TABLET BY MOUTH EVERY DAY -     carvedilol (COREG) 6.25 MG tablet; Take 1 tablet (6.25 mg  total) by mouth 2 (two) times daily with a meal. -     CBC with Differential/Platelet -     CMP14+EGFR -     Lipid panel  Idiopathic gout, unspecified chronicity, unspecified site -     allopurinol (ZYLOPRIM) 100 MG tablet; TAKE 2 TABLETS (200 MG TOTAL) BY MOUTH DAILY. -     CBC with Differential/Platelet -     CMP14+EGFR  Severe obesity (BMI >= 40) (HCC) -     CBC with Differential/Platelet -     CMP14+EGFR -     Lipid panel -     Testosterone,Free and Total  Erectile dysfunction, unspecified erectile dysfunction type -     sildenafil (REVATIO) 20 MG tablet; Take 2-5 pills at once, orally, with each sexual encounter -     CBC with Differential/Platelet -     CMP14+EGFR -     Testosterone,Free and Total  Other orders -     cyclobenzaprine (FLEXERIL) 10 MG tablet; Take 1 tablet (10 mg total) by mouth at bedtime. To relax muscles   Allergies as of 10/06/2019   No Known Allergies     Medication List       Accurate as of October 06, 2019  8:34 PM. If you have any questions, ask your nurse or doctor.        STOP taking these medications   nebivolol 5 MG tablet Commonly known as: Bystolic Stopped by: [name], MD     TAKE these medications   allopurinol 100 MG tablet Commonly known as: ZYLOPRIM TAKE 2 TABLETS (200 MG TOTAL) BY MOUTH DAILY. What changed: additional instructions Changed by: [name], MD   amLODipine 10 MG tablet Commonly known as: NORVASC TAKE 1 TABLET (10 MG TOTAL) BY MOUTH DAILY.for blood pressure   carvedilol 6.25 MG tablet Commonly known as: Coreg Take 1 tablet (6.25 mg total) by mouth 2 (two) times daily with a meal. Started by: [name], MD   cyclobenzaprine 10 MG tablet Commonly known as: FLEXERIL Take 1 tablet (10 mg total) by mouth at bedtime. To relax muscles Started by: [name], MD   hydrochlorothiazide 25 MG tablet Commonly known as: HYDRODIURIL TAKE 1 TABLET BY MOUTH EVERY DAY   sildenafil 20 MG  tablet Commonly known as: REVATIO Take 2-5 pills at once, orally, with each sexual encounter   Vitamin D (Ergocalciferol) 1.25 MG (50000 UNIT) Caps capsule Commonly known as: DRISDOL Take 1 capsule (50,000 Units total) by mouth every 7 (seven) days.       Meds ordered this encounter  Medications  . allopurinol (ZYLOPRIM) 100 MG tablet    Sig: TAKE 2 TABLETS (200 MG TOTAL) BY MOUTH DAILY.  Dispense:  180 tablet    Refill:  2  . amLODipine (NORVASC) 10 MG tablet    Sig: TAKE 1 TABLET (10 MG TOTAL) BY MOUTH DAILY.for blood pressure    Dispense:  90 tablet    Refill:  2  . hydrochlorothiazide (HYDRODIURIL) 25 MG tablet    Sig: TAKE 1 TABLET BY MOUTH EVERY DAY    Dispense:  90 tablet    Refill:  2  . sildenafil (REVATIO) 20 MG tablet    Sig: Take 2-5 pills at once, orally, with each sexual encounter    Dispense:  50 tablet    Refill:  5  . carvedilol (COREG) 6.25 MG tablet    Sig: Take 1 tablet (6.25 mg total) by mouth 2 (two) times daily with a meal.    Dispense:  180 tablet    Refill:  1  . cyclobenzaprine (FLEXERIL) 10 MG tablet    Sig: Take 1 tablet (10 mg total) by mouth at bedtime. To relax muscles    Dispense:  30 tablet    Refill:  2       Follow-up: Return in about 6 months (around 04/07/2020).  [name], M.D.

## 2019-10-10 LAB — CBC WITH DIFFERENTIAL/PLATELET
Basophils Absolute: 0 10*3/uL (ref 0.0–0.2)
Basos: 1 %
EOS (ABSOLUTE): 0.1 10*3/uL (ref 0.0–0.4)
Eos: 3 %
Hematocrit: 40.9 % (ref 37.5–51.0)
Hemoglobin: 13.6 g/dL (ref 13.0–17.7)
Immature Grans (Abs): 0 10*3/uL (ref 0.0–0.1)
Immature Granulocytes: 0 %
Lymphocytes Absolute: 1.5 10*3/uL (ref 0.7–3.1)
Lymphs: 36 %
MCH: 29.8 pg (ref 26.6–33.0)
MCHC: 33.3 g/dL (ref 31.5–35.7)
MCV: 90 fL (ref 79–97)
Monocytes Absolute: 0.3 10*3/uL (ref 0.1–0.9)
Monocytes: 8 %
Neutrophils Absolute: 2.1 10*3/uL (ref 1.4–7.0)
Neutrophils: 52 %
Platelets: 283 10*3/uL (ref 150–450)
RBC: 4.56 x10E6/uL (ref 4.14–5.80)
RDW: 12.2 % (ref 11.6–15.4)
WBC: 4.1 10*3/uL (ref 3.4–10.8)

## 2019-10-10 LAB — CMP14+EGFR
ALT: 38 IU/L (ref 0–44)
AST: 44 IU/L — ABNORMAL HIGH (ref 0–40)
Albumin/Globulin Ratio: 1.6 (ref 1.2–2.2)
Albumin: 4.5 g/dL (ref 3.8–4.9)
Alkaline Phosphatase: 101 IU/L (ref 48–121)
BUN/Creatinine Ratio: 18 (ref 9–20)
BUN: 17 mg/dL (ref 6–24)
Bilirubin Total: 0.4 mg/dL (ref 0.0–1.2)
CO2: 24 mmol/L (ref 20–29)
Calcium: 9.8 mg/dL (ref 8.7–10.2)
Chloride: 101 mmol/L (ref 96–106)
Creatinine, Ser: 0.96 mg/dL (ref 0.76–1.27)
GFR calc Af Amer: 105 mL/min/{1.73_m2} (ref >59)
GFR calc non Af Amer: 91 mL/min/{1.73_m2} (ref >59)
Globulin, Total: 2.8 g/dL (ref 1.5–4.5)
Glucose: 111 mg/dL — ABNORMAL HIGH (ref 65–99)
Potassium: 3.7 mmol/L (ref 3.5–5.2)
Sodium: 140 mmol/L (ref 134–144)
Total Protein: 7.3 g/dL (ref 6.0–8.5)

## 2019-10-10 LAB — LIPID PANEL
Chol/HDL Ratio: 4.2 ratio (ref 0.0–5.0)
Cholesterol, Total: 188 mg/dL (ref 100–199)
HDL: 45 mg/dL (ref >39)
LDL Chol Calc (NIH): 108 mg/dL — ABNORMAL HIGH (ref 0–99)
Triglycerides: 201 mg/dL — ABNORMAL HIGH (ref 0–149)
VLDL Cholesterol Cal: 35 mg/dL (ref 5–40)

## 2019-10-10 LAB — TESTOSTERONE,FREE AND TOTAL
Testosterone, Free: 6.1 pg/mL — ABNORMAL LOW (ref 7.2–24.0)
Testosterone: 350 ng/dL (ref 264–916)

## 2019-10-11 ENCOUNTER — Ambulatory Visit: Payer: No Typology Code available for payment source | Admitting: Family Medicine

## 2020-03-30 ENCOUNTER — Other Ambulatory Visit: Payer: Self-pay | Admitting: Family Medicine

## 2020-03-30 DIAGNOSIS — I10 Essential (primary) hypertension: Secondary | ICD-10-CM

## 2020-04-05 ENCOUNTER — Telehealth: Payer: Self-pay | Admitting: *Deleted

## 2020-04-05 ENCOUNTER — Other Ambulatory Visit: Payer: Self-pay

## 2020-04-05 ENCOUNTER — Ambulatory Visit (INDEPENDENT_AMBULATORY_CARE_PROVIDER_SITE_OTHER): Payer: No Typology Code available for payment source | Admitting: Family Medicine

## 2020-04-05 ENCOUNTER — Encounter: Payer: Self-pay | Admitting: Family Medicine

## 2020-04-05 VITALS — BP 140/93 | HR 88 | Temp 98.2°F | Ht 70.0 in | Wt 287.2 lb

## 2020-04-05 DIAGNOSIS — M722 Plantar fascial fibromatosis: Secondary | ICD-10-CM

## 2020-04-05 DIAGNOSIS — E559 Vitamin D deficiency, unspecified: Secondary | ICD-10-CM

## 2020-04-05 DIAGNOSIS — N529 Male erectile dysfunction, unspecified: Secondary | ICD-10-CM | POA: Diagnosis not present

## 2020-04-05 DIAGNOSIS — M79673 Pain in unspecified foot: Secondary | ICD-10-CM

## 2020-04-05 DIAGNOSIS — Z23 Encounter for immunization: Secondary | ICD-10-CM

## 2020-04-05 DIAGNOSIS — M1000 Idiopathic gout, unspecified site: Secondary | ICD-10-CM | POA: Diagnosis not present

## 2020-04-05 DIAGNOSIS — I10 Essential (primary) hypertension: Secondary | ICD-10-CM | POA: Diagnosis not present

## 2020-04-05 MED ORDER — CELECOXIB 200 MG PO CAPS: 200.0000 mg | ORAL_CAPSULE | Freq: Every day | ORAL | 5 refills | Status: DC

## 2020-04-05 MED ORDER — CARVEDILOL 6.25 MG PO TABS: 6.2500 mg | ORAL_TABLET | Freq: Two times a day (BID) | ORAL | 0 refills | Status: DC

## 2020-04-05 MED ORDER — AMLODIPINE BESYLATE 10 MG PO TABS: ORAL_TABLET | ORAL | 2 refills | Status: DC

## 2020-04-05 MED ORDER — SILDENAFIL CITRATE 20 MG PO TABS: ORAL_TABLET | ORAL | 5 refills | Status: DC

## 2020-04-05 MED ORDER — HYDROCHLOROTHIAZIDE 25 MG PO TABS: ORAL_TABLET | ORAL | 2 refills | Status: DC

## 2020-04-05 MED ORDER — VITAMIN D (ERGOCALCIFEROL) 1.25 MG (50000 UNIT) PO CAPS: 50000.0000 [IU] | ORAL_CAPSULE | ORAL | 0 refills | Status: DC

## 2020-04-05 MED ORDER — ALLOPURINOL 100 MG PO TABS: ORAL_TABLET | ORAL | 2 refills | Status: DC

## 2020-04-05 NOTE — Telephone Encounter (Signed)
Drug Celecoxib 200MG  capsules Key: [id number] Sent to [facility] today

## 2020-04-05 NOTE — Progress Notes (Signed)
Subjective:  Patient ID: Frank Herrera, male    DOB: 1965-05-17  Age: 55 y.o. MRN: [id number]  CC: Medical Management of Chronic Issues   HPI Frank Herrera presents for  follow-up of hypertension. Patient has no history of headache chest pain or shortness of breath or recent cough. Patient also denies symptoms of TIA such as focal numbness or weakness. Patient denies side effects from medication. States taking it regularly. Runs about 130/78 at home  Moderately severe foot pain. Bilateral. Worse the more he walks on it. Onset 2 weeks ago. Getting worse. Hard to walk on it. NKI. Planning to see podiatry.    History Frank Herrera has a past medical history of Hypertension and Lead exposure (03/19/2015).   He has a past surgical history that includes Spine surgery (2010) and Hernia repair (1990).   His family history includes Heart disease in his father; Hypertension in his brother, father, mother, and sister; Kidney disease in his father.He reports that he has never smoked. He has never used smokeless tobacco. He reports current alcohol use. He reports that he does not use drugs.  Current Outpatient Medications on File Prior to Visit  Medication Sig Dispense Refill  . cyclobenzaprine (FLEXERIL) 10 MG tablet Take 1 tablet (10 mg total) by mouth at bedtime. To relax muscles (Patient not taking: Reported on 04/05/2020) 30 tablet 2   No current facility-administered medications on file prior to visit.    ROS Review of Systems  Constitutional: Negative for fever.  Respiratory: Negative for shortness of breath.   Cardiovascular: Negative for chest pain.  Musculoskeletal: Positive for arthralgias, back pain (seeing chiropractor) and gait problem (foot pain).  Skin: Negative for rash.    Objective:  BP (!) 140/93   Pulse 88   Temp 98.2 F (36.8 C) (Temporal)   Ht $R'5\' 10"'En$  (1.778 m)   Wt 287 lb 3.2 oz (130.3 kg)   BMI 41.21 kg/m   BP Readings from Last 3 Encounters:  04/05/20 (!) 140/93   10/06/19 (!) 136/83  08/09/19 (!) 142/92    Wt Readings from Last 3 Encounters:  04/05/20 287 lb 3.2 oz (130.3 kg)  10/06/19 (!) 284 lb (128.8 kg)  08/09/19 287 lb 6.4 oz (130.4 kg)     Physical Exam Vitals reviewed.  Constitutional:      Appearance: He is well-developed and well-nourished.  HENT:     Head: Normocephalic and atraumatic.     Right Ear: Tympanic membrane and external ear normal. No decreased hearing noted.     Left Ear: Tympanic membrane and external ear normal. No decreased hearing noted.     Mouth/Throat:     Pharynx: No oropharyngeal exudate or posterior oropharyngeal erythema.  Eyes:     Pupils: Pupils are equal, round, and reactive to light.  Cardiovascular:     Rate and Rhythm: Normal rate and regular rhythm.     Heart sounds: No murmur heard.   Pulmonary:     Effort: No respiratory distress.     Breath sounds: Normal breath sounds.  Abdominal:     General: Bowel sounds are normal.     Palpations: Abdomen is soft. There is no mass.     Tenderness: There is no abdominal tenderness.  Musculoskeletal:        General: Tenderness (plantar foot at heel) present.     Cervical back: Normal range of motion and neck supple.       Assessment & Plan:   [name] was seen today  for medical management of chronic issues.  Diagnoses and all orders for this visit:  Need for immunization against influenza -     Flu Vaccine QUAD 36+ mos IM -     CBC with Differential/Platelet -     CMP14+EGFR  Vitamin D deficiency -     Vitamin D, Ergocalciferol, (DRISDOL) 1.25 MG (50000 UNIT) CAPS capsule; Take 1 capsule (50,000 Units total) by mouth every 7 (seven) days. -     CBC with Differential/Platelet -     CMP14+EGFR -     VITAMIN D 25 Hydroxy (Vit-D Deficiency, Fractures)  Erectile dysfunction, unspecified erectile dysfunction type -     sildenafil (REVATIO) 20 MG tablet; Take 2-5 pills at once, orally, with each sexual encounter -     CBC with  Differential/Platelet -     CMP14+EGFR  Essential hypertension -     hydrochlorothiazide (HYDRODIURIL) 25 MG tablet; TAKE 1 TABLET BY MOUTH EVERY DAY -     carvedilol (COREG) 6.25 MG tablet; Take 1 tablet (6.25 mg total) by mouth 2 (two) times daily with a meal. -     amLODipine (NORVASC) 10 MG tablet; TAKE 1 TABLET (10 MG TOTAL) BY MOUTH DAILY.for blood pressure -     CBC with Differential/Platelet -     CMP14+EGFR -     Lipid panel -     Ambulatory referral to Cardiology  Idiopathic gout, unspecified chronicity, unspecified site -     allopurinol (ZYLOPRIM) 100 MG tablet; TAKE 2 TABLETS (200 MG TOTAL) BY MOUTH DAILY. -     CBC with Differential/Platelet -     CMP14+EGFR -     Uric acid  Severe obesity (BMI >= 40) (HCC) -     CBC with Differential/Platelet -     CMP14+EGFR -     Ambulatory referral to Cardiology  Pain of foot, unspecified laterality  Plantar fasciitis, bilateral  Other orders -     celecoxib (CELEBREX) 200 MG capsule; Take 1 capsule (200 mg total) by mouth daily. With food   Allergies as of 04/05/2020   No Known Allergies     Medication List       Accurate as of April 05, 2020  2:56 PM. If you have any questions, ask your nurse or doctor.        allopurinol 100 MG tablet Commonly known as: ZYLOPRIM TAKE 2 TABLETS (200 MG TOTAL) BY MOUTH DAILY.   amLODipine 10 MG tablet Commonly known as: NORVASC TAKE 1 TABLET (10 MG TOTAL) BY MOUTH DAILY.for blood pressure   carvedilol 6.25 MG tablet Commonly known as: COREG Take 1 tablet (6.25 mg total) by mouth 2 (two) times daily with a meal.   celecoxib 200 MG capsule Commonly known as: CeleBREX Take 1 capsule (200 mg total) by mouth daily. With food Started by: [name], MD   cyclobenzaprine 10 MG tablet Commonly known as: FLEXERIL Take 1 tablet (10 mg total) by mouth at bedtime. To relax muscles   hydrochlorothiazide 25 MG tablet Commonly known as: HYDRODIURIL TAKE 1 TABLET BY MOUTH EVERY  DAY   sildenafil 20 MG tablet Commonly known as: REVATIO Take 2-5 pills at once, orally, with each sexual encounter   Vitamin D (Ergocalciferol) 1.25 MG (50000 UNIT) Caps capsule Commonly known as: DRISDOL Take 1 capsule (50,000 Units total) by mouth every 7 (seven) days.       Meds ordered this encounter  Medications  . Vitamin D, Ergocalciferol, (DRISDOL) 1.25 MG (50000 UNIT) CAPS  capsule    Sig: Take 1 capsule (50,000 Units total) by mouth every 7 (seven) days.    Dispense:  13 capsule    Refill:  0  . sildenafil (REVATIO) 20 MG tablet    Sig: Take 2-5 pills at once, orally, with each sexual encounter    Dispense:  50 tablet    Refill:  5  . hydrochlorothiazide (HYDRODIURIL) 25 MG tablet    Sig: TAKE 1 TABLET BY MOUTH EVERY DAY    Dispense:  90 tablet    Refill:  2  . carvedilol (COREG) 6.25 MG tablet    Sig: Take 1 tablet (6.25 mg total) by mouth 2 (two) times daily with a meal.    Dispense:  60 tablet    Refill:  0  . allopurinol (ZYLOPRIM) 100 MG tablet    Sig: TAKE 2 TABLETS (200 MG TOTAL) BY MOUTH DAILY.    Dispense:  180 tablet    Refill:  2  . amLODipine (NORVASC) 10 MG tablet    Sig: TAKE 1 TABLET (10 MG TOTAL) BY MOUTH DAILY.for blood pressure    Dispense:  90 tablet    Refill:  2  . celecoxib (CELEBREX) 200 MG capsule    Sig: Take 1 capsule (200 mg total) by mouth daily. With food    Dispense:  30 capsule    Refill:  5      Follow-up: Return in about 6 months (around 10/03/2020).  [name], M.D.

## 2020-04-05 NOTE — Telephone Encounter (Signed)
Approved today Approved. This drug has been approved. Approved quantity: 30 units per 30 day(s). The drug has been approved from 03/22/2020 to 04/05/2021. Please call the pharmacy to process your prescription claim.  [facility]

## 2020-04-06 LAB — CBC WITH DIFFERENTIAL/PLATELET
Basophils Absolute: 0 10*3/uL (ref 0.0–0.2)
Basos: 1 %
EOS (ABSOLUTE): 0.1 10*3/uL (ref 0.0–0.4)
Eos: 2 %
Hematocrit: 40.1 % (ref 37.5–51.0)
Hemoglobin: 13.6 g/dL (ref 13.0–17.7)
Immature Grans (Abs): 0 10*3/uL (ref 0.0–0.1)
Immature Granulocytes: 0 %
Lymphocytes Absolute: 1.9 10*3/uL (ref 0.7–3.1)
Lymphs: 36 %
MCH: 30 pg (ref 26.6–33.0)
MCHC: 33.9 g/dL (ref 31.5–35.7)
MCV: 88 fL (ref 79–97)
Monocytes Absolute: 0.6 10*3/uL (ref 0.1–0.9)
Monocytes: 11 %
Neutrophils Absolute: 2.7 10*3/uL (ref 1.4–7.0)
Neutrophils: 50 %
Platelets: 294 10*3/uL (ref 150–450)
RBC: 4.54 x10E6/uL (ref 4.14–5.80)
RDW: 12.3 % (ref 11.6–15.4)
WBC: 5.4 10*3/uL (ref 3.4–10.8)

## 2020-04-06 LAB — LIPID PANEL
Chol/HDL Ratio: 3.8 ratio (ref 0.0–5.0)
Cholesterol, Total: 196 mg/dL (ref 100–199)
HDL: 52 mg/dL (ref >39)
LDL Chol Calc (NIH): 115 mg/dL — ABNORMAL HIGH (ref 0–99)
Triglycerides: 166 mg/dL — ABNORMAL HIGH (ref 0–149)
VLDL Cholesterol Cal: 29 mg/dL (ref 5–40)

## 2020-04-06 LAB — CMP14+EGFR
ALT: 29 IU/L (ref 0–44)
AST: 29 IU/L (ref 0–40)
Albumin/Globulin Ratio: 1.7 (ref 1.2–2.2)
Albumin: 4.7 g/dL (ref 3.8–4.9)
Alkaline Phosphatase: 115 IU/L (ref 44–121)
BUN/Creatinine Ratio: 15 (ref 9–20)
BUN: 15 mg/dL (ref 6–24)
Bilirubin Total: 0.5 mg/dL (ref 0.0–1.2)
CO2: 25 mmol/L (ref 20–29)
Calcium: 10.1 mg/dL (ref 8.7–10.2)
Chloride: 96 mmol/L (ref 96–106)
Creatinine, Ser: 0.97 mg/dL (ref 0.76–1.27)
GFR calc Af Amer: 103 mL/min/{1.73_m2} (ref >59)
GFR calc non Af Amer: 89 mL/min/{1.73_m2} (ref >59)
Globulin, Total: 2.7 g/dL (ref 1.5–4.5)
Glucose: 118 mg/dL — ABNORMAL HIGH (ref 65–99)
Potassium: 3.8 mmol/L (ref 3.5–5.2)
Sodium: 136 mmol/L (ref 134–144)
Total Protein: 7.4 g/dL (ref 6.0–8.5)

## 2020-04-06 LAB — URIC ACID: Uric Acid: 6.7 mg/dL (ref 3.8–8.4)

## 2020-04-06 LAB — VITAMIN D 25 HYDROXY (VIT D DEFICIENCY, FRACTURES): Vit D, 25-Hydroxy: 19.1 ng/mL — ABNORMAL LOW (ref 30.0–100.0)

## 2020-04-17 ENCOUNTER — Telehealth: Payer: Self-pay | Admitting: *Deleted

## 2020-04-17 ENCOUNTER — Other Ambulatory Visit: Payer: Self-pay

## 2020-04-17 ENCOUNTER — Encounter: Payer: Self-pay | Admitting: Internal Medicine

## 2020-04-17 ENCOUNTER — Ambulatory Visit: Payer: No Typology Code available for payment source | Admitting: Internal Medicine

## 2020-04-17 VITALS — BP 140/100 | HR 96 | Ht 70.0 in | Wt 289.0 lb

## 2020-04-17 DIAGNOSIS — R400 Somnolence: Secondary | ICD-10-CM | POA: Diagnosis not present

## 2020-04-17 DIAGNOSIS — I10 Essential (primary) hypertension: Secondary | ICD-10-CM

## 2020-04-17 DIAGNOSIS — E785 Hyperlipidemia, unspecified: Secondary | ICD-10-CM

## 2020-04-17 DIAGNOSIS — Z8279 Family history of other congenital malformations, deformations and chromosomal abnormalities: Secondary | ICD-10-CM

## 2020-04-17 MED ORDER — ATORVASTATIN CALCIUM 20 MG PO TABS: 20.0000 mg | ORAL_TABLET | Freq: Every day | ORAL | 3 refills | Status: DC

## 2020-04-17 MED ORDER — CARVEDILOL 12.5 MG PO TABS: 12.5000 mg | ORAL_TABLET | Freq: Two times a day (BID) | ORAL | 3 refills | Status: DC

## 2020-04-17 NOTE — Addendum Note (Signed)
Addended by: Macie Burows on: 04/17/2020 12:57 PM   Modules accepted: Orders

## 2020-04-17 NOTE — Progress Notes (Signed)
Cardiology Office Note:    Date:  04/17/2020   ID:  Frank Herrera, DOB 03-06-1966, MRN [id number]  PCP:  Mechele Claude, MD  [facility] Cardiologist:  No primary care provider on file.  [facility] Electrophysiologist:  None   CC: Get Everything checked out Consulted for the evaluation of HTN at the behest of Mechele Claude, MD  History of Present Illness:    Frank Herrera is a 55 y.o. male with a hx of Morbid Obesity with HTN, HLD, Erectile dysfunction, Gout, TIA, who presents for evaluation 04/17/20.  Patient notes that he is feeling good.  Has had no chest pain, chest pressure, chest tightness, chest stinging.  Discomfort occurs with his arms and legs related to drop foot and moving diferently.  Patient exertion notable for coming here today and walking in the yard and feels no breathing issues.  No shortness of breath at rest.  No PND or orthopnea.  Notes bendopnea, notes 5 lbs weight gain, leg swelling , or abdominal swelling.  No syncope or near syncope . Notes  no palpitations or funny heart beats.     Patient reports prior cardiac testing including  distant stress test four years prior.  No history of  prematurity.  No  drug use.  Ambulatory BP done sporadically.   Past Medical History:  Diagnosis Date  . Hypertension   . Lead exposure 03/19/2015    Past Surgical History:  Procedure Laterality Date  . HERNIA REPAIR  1990  . SPINE SURGERY  2010    Current Medications: Current Meds  Medication Sig  . allopurinol (ZYLOPRIM) 100 MG tablet TAKE 2 TABLETS (200 MG TOTAL) BY MOUTH DAILY.  Marland Kitchen amLODipine (NORVASC) 10 MG tablet TAKE 1 TABLET (10 MG TOTAL) BY MOUTH DAILY.for blood pressure  . atorvastatin (LIPITOR) 20 MG tablet Take 1 tablet (20 mg total) by mouth daily.  . carvedilol (COREG) 12.5 MG tablet Take 1 tablet (12.5 mg total) by mouth 2 (two) times daily.  . celecoxib (CELEBREX) 200 MG capsule Take 1 capsule (200 mg total) by mouth daily. With food  .  cyclobenzaprine (FLEXERIL) 10 MG tablet Take 1 tablet (10 mg total) by mouth at bedtime. To relax muscles  . hydrochlorothiazide (HYDRODIURIL) 25 MG tablet TAKE 1 TABLET BY MOUTH EVERY DAY  . sildenafil (REVATIO) 20 MG tablet Take 2-5 pills at once, orally, with each sexual encounter  . Vitamin D, Ergocalciferol, (DRISDOL) 1.25 MG (50000 UNIT) CAPS capsule Take 1 capsule (50,000 Units total) by mouth every 7 (seven) days.  . [DISCONTINUED] carvedilol (COREG) 6.25 MG tablet Take 1 tablet (6.25 mg total) by mouth 2 (two) times daily with a meal.     Allergies:   Patient has no known allergies.   Social History   Socioeconomic History  . Marital status: Single    Spouse name: Not on file  . Number of children: 0  . Years of education: 64  . Highest education level: Not on file  Occupational History  . Occupation: [employer]  Tobacco Use  . Smoking status: Never Smoker  . Smokeless tobacco: Never Used  Vaping Use  . Vaping Use: Never used  Substance and Sexual Activity  . Alcohol use: Yes    Comment: Twice per month  . Drug use: No  . Sexual activity: Not on file  Other Topics Concern  . 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 There is no history of Neuropathy. History of coronary artery disease notable for father. History of heart failure notable for no members. History valve surgery NOS done at age 55. No history of  cardiomyopathies including hypertrophic cardiomyopathy, left ventricular non-compaction, or arrhythmogenic right ventricular cardiomyopathy. History of arrhythmia notable for no members. Denies family history of sudden cardiac death including drowning, car accidents, or unexplained deaths in the family. No history of bicuspid aortic valve or aortic aneurysm or dissection. History of PAD in father.  ROS:   Please see the history of present illness.    Patient notes right left drop foot.  Notes apneic events at night when supine.   All other systems reviewed and are negative.  EKGs/Labs/Other Studies Reviewed:    The following studies were reviewed today:  EKG:   04/17/20: Sinus rhythm 96 nonspecific TWI  Echo Stress Testing: Date: 10/11/2015 Results: Study Conclusions  - Stress ECG conclusions: There were no stress arrhythmias or  conduction abnormalities. The stress ECG was negative for  ischemia.  - Staged echo: There was no echocardiographic evidence for  stress-induced ischemia.  - Impressions: Normal Dobutamine stress echo with increase in EF  and decrease in LV cavity size  No RWMA;s at highter HR/doses of dobutamine.   Impressions:   - Normal Dobutamine stress echo with increase in EF and decrease in  LV cavity size  No RWMA;s at highter HR/doses of dobutamine.     Recent Labs: 04/05/2020: ALT 29; BUN 15; Creatinine, Ser 0.97; Hemoglobin 13.6; Platelets 294; Potassium 3.8; Sodium 136  Recent Lipid Panel    Component Value Date/Time   CHOL 196 04/05/2020 0924   TRIG 166 (H) 04/05/2020 0924   HDL 52 04/05/2020 0924   CHOLHDL 3.8 04/05/2020 0924   LDLCALC 115 (H) 04/05/2020 0924     Risk Assessment/Calculations:    The 10-year ASCVD risk score Denman George DC Montez Hageman., et al., 2013) is: 11.1%   Values used to calculate the score:     Age: 64 years     Sex: Male     Is Non-Hispanic African American: Yes     Diabetic: No     Tobacco smoker: No     Systolic  Blood Pressure: 140 mmHg     Is BP treated: Yes     HDL Cholesterol: 52 mg/dL     Total Cholesterol: 196 mg/dL    Physical Exam:    VS:  BP (!) 140/100   Pulse 96   Ht 5\' 10"  (1.778 m)   Wt 289 lb (131.1 kg)   SpO2 97%   BMI 41.47 kg/m     Wt Readings from Last 3 Encounters:  04/17/20 289 lb (131.1 kg)  04/05/20 287 lb 3.2 oz (130.3 kg)  10/06/19 (!) 284 lb (128.8 kg)    GEN: Obese, well developed in no acute distress HEENT: Normal NECK: No JVD; No carotid bruits LYMPHATICS: No lymphadenopathy CARDIAC: RRR, no murmurs, rubs, gallops RESPIRATORY:  Clear  to auscultation without rales, wheezing or rhonchi  ABDOMEN: Soft, non-tender, non-distended MUSCULOSKELETAL:  No edema; No deformity  SKIN: Warm and dry NEUROLOGIC:  Alert and oriented x 3 PSYCHIATRIC:  Normal affect   ASSESSMENT:    1. Family history of bicuspid aortic valve   2. Essential hypertension   3. Severe obesity (BMI >= 40) (HCC)   4. Daytime somnolence   5. Hyperlipidemia, unspecified hyperlipidemia type    PLAN:    In order of problems listed above:  Possible Family history of a bicuspid valve - Will get echocardiogram  Essential Hypertension Morbid Obesity Daytime Somnolence - ambulatory blood pressure not done, will start ambulatory BP monitoring; gave education on how to perform ambulatory blood pressure monitoring including the frequency and technique; goal ambulatory blood pressure < 135/85 on average - continue home medications with the exception of Increasing Coreg to 12.5 mg PO BID - OSA Risk High , Epworth Sleepiness Scale great than 18- will get sleep study - discussed diet (DASH/low sodium), and exercise/weight loss interventions   Hyperlipidemia (mixed) - ASCVD Risk 11.1% -LDL goal less than 100 -will start rosuvastatin 10 mg PO Daily -Recheck lipid profile LFTs in three months (fasting) - gave education on dietary changes  Three months follow up unless new symptoms or abnormal  test results warranting change in plan  Would be reasonable for  APP Follow up   Medication Adjustments/Labs and Tests Ordered: Current medicines are reviewed at length with the patient today.  Concerns regarding medicines are outlined above.  Orders Placed This Encounter  Procedures  . Lipid panel  . Hepatic function panel  . Ambulatory referral to Sleep Studies  . EKG 12-Lead  . ECHOCARDIOGRAM COMPLETE   Meds ordered this encounter  Medications  . atorvastatin (LIPITOR) 20 MG tablet    Sig: Take 1 tablet (20 mg total) by mouth daily.    Dispense:  90 tablet    Refill:  3  . carvedilol (COREG) 12.5 MG tablet    Sig: Take 1 tablet (12.5 mg total) by mouth 2 (two) times daily.    Dispense:  180 tablet    Refill:  3    Patient Instructions  Medication Instructions:  Your physician has recommended you make the following change in your medication:   INCREASE: carvedilol to 12.5mg  twice daily  START: atorvastatin (Lipitor) 20mg  daily  *If you need a refill on your cardiac medications before your next appointment, please call your pharmacy*   Lab Work: :  LFT, fasting lipid panel  If you have labs (blood work) drawn today and your tests are completely normal, you will receive your results only by: MyChart Message (if you have MyChart) OR . A paper copy in the mail If you have any lab test that is abnormal or we need to change your treatment, we will call you to review the results.   Testing/Procedures: Your physician has requested that you have an echocardiogram. Echocardiography is a painless test that uses sound waves to create images of your heart. It provides your doctor with information about the size and shape of your heart and how well your heart's chambers and valves are working. This procedure takes approximately one hour. There are no restrictions for this procedure.     Follow-Up: At [facility], you and your health needs are our priority.  As  part of our continuing mission to provide you with exceptional heart care, we have created designated Provider Care Teams.  These Care Teams  include your primary Cardiologist (physician) and Advanced Practice Providers (APPs -  Physician Assistants and Nurse Practitioners) who all work together to provide you with the care you need, when you need it.  We recommend signing up for the patient portal called "MyChart".  Sign up information is provided on this After Visit Summary.  MyChart is used to connect with patients for Virtual Visits (Telemedicine).  Patients are able to view lab/test results, encounter notes, upcoming appointments, etc.  Non-urgent messages can be sent to your provider as well.   To learn more about what you can do with MyChart, go to [email or web address].    Your next appointment:   3 month(s)  The format for your next appointment:   In Person  Provider:   You may see Izora Ribas, MD or one of the following Advanced Practice Providers on your designated Care Team:    Ronie Spies, PA-C  Jacolyn Reedy, PA-C    Other Instructions Your physician has requested that you regularly monitor and record your blood pressure readings at home. Please use the same machine at the same time of day to check your readings and record them to bring to your follow-up visit.  You have been referred for a sleep study.          Signed, Christell Constant, MD  04/17/2020 10:06 AM    [facility]

## 2020-04-17 NOTE — Telephone Encounter (Signed)
-----   Message from Macie Burows, RN sent at 04/17/2020  9:50 AM EST ----- Regarding: sleep study Dr. Izora Ribas has requested that this patient have a sleep study for daytime somnolence.

## 2020-04-17 NOTE — Telephone Encounter (Signed)
PA request for sleep study faxed to [employer] @ [phone].

## 2020-04-17 NOTE — Patient Instructions (Signed)
Medication Instructions:  Your physician has recommended you make the following change in your medication:   INCREASE: carvedilol to 12.5mg  twice daily  START: atorvastatin (Lipitor) 20mg  daily  *If you need a refill on your cardiac medications before your next appointment, please call your pharmacy*   Lab Work: :  LFT, fasting lipid panel  If you have labs (blood work) drawn today and your tests are completely normal, you will receive your results only by: MyChart Message (if you have MyChart) OR . A paper copy in the mail If you have any lab test that is abnormal or we need to change your treatment, we will call you to review the results.   Testing/Procedures: Your physician has requested that you have an echocardiogram. Echocardiography is a painless test that uses sound waves to create images of your heart. It provides your doctor with information about the size and shape of your heart and how well your heart's chambers and valves are working. This procedure takes approximately one hour. There are no restrictions for this procedure.     Follow-Up: At [facility], you and your health needs are our priority.  As part of our continuing mission to provide you with exceptional heart care, we have created designated Provider Care Teams.  These Care Teams include your primary Cardiologist (physician) and Advanced Practice Providers (APPs -  Physician Assistants and Nurse Practitioners) who all work together to provide you with the care you need, when you need it.  We recommend signing up for the patient portal called "MyChart".  Sign up information is provided on this After Visit Summary.  MyChart is used to connect with patients for Virtual Visits (Telemedicine).  Patients are able to view lab/test results, encounter notes, upcoming appointments, etc.  Non-urgent messages can be sent to your provider as well.   To learn more about what you can do with MyChart, go to  [facility].    Your next appointment:   3 month(s)  The format for your next appointment:   In Person  Provider:   You may see [email or web address], MD or one of the following Advanced Practice Providers on your designated Care Team:    Izora Ribas, PA-C  Ronie Spies, PA-C    Other Instructions Your physician has requested that you regularly monitor and record your blood pressure readings at home. Please use the same machine at the same time of day to check your readings and record them to bring to your follow-up visit.  You have been referred for a sleep study.

## 2020-04-25 ENCOUNTER — Other Ambulatory Visit: Payer: Self-pay | Admitting: Family Medicine

## 2020-04-25 DIAGNOSIS — I10 Essential (primary) hypertension: Secondary | ICD-10-CM

## 2020-05-04 NOTE — Telephone Encounter (Signed)
Reached out to patient and got a new member id ([id number]) for his insurance. Patient states all other information is correct.

## 2020-05-08 ENCOUNTER — Ambulatory Visit (HOSPITAL_COMMUNITY): Payer: PRIVATE HEALTH INSURANCE | Attending: Cardiovascular Disease

## 2020-05-08 ENCOUNTER — Other Ambulatory Visit: Payer: Self-pay

## 2020-05-08 DIAGNOSIS — Z8279 Family history of other congenital malformations, deformations and chromosomal abnormalities: Secondary | ICD-10-CM | POA: Diagnosis present

## 2020-05-08 DIAGNOSIS — E669 Obesity, unspecified: Secondary | ICD-10-CM | POA: Insufficient documentation

## 2020-05-08 DIAGNOSIS — I119 Hypertensive heart disease without heart failure: Secondary | ICD-10-CM | POA: Insufficient documentation

## 2020-05-08 DIAGNOSIS — Z8673 Personal history of transient ischemic attack (TIA), and cerebral infarction without residual deficits: Secondary | ICD-10-CM | POA: Insufficient documentation

## 2020-05-08 DIAGNOSIS — E785 Hyperlipidemia, unspecified: Secondary | ICD-10-CM | POA: Diagnosis not present

## 2020-05-08 LAB — ECHOCARDIOGRAM COMPLETE
Area-P 1/2: 3.85 cm2
S' Lateral: 3.1 cm

## 2020-05-09 ENCOUNTER — Telehealth: Payer: Self-pay | Admitting: Nurse Practitioner

## 2020-05-09 ENCOUNTER — Other Ambulatory Visit: Payer: Self-pay | Admitting: Nurse Practitioner

## 2020-05-09 DIAGNOSIS — I712 Thoracic aortic aneurysm, without rupture, unspecified: Secondary | ICD-10-CM

## 2020-05-09 DIAGNOSIS — I10 Essential (primary) hypertension: Secondary | ICD-10-CM

## 2020-05-09 NOTE — Telephone Encounter (Signed)
-----   Message from Christell Constant, MD sent at 05/09/2020  9:08 AM EST ----- Results: Mild Thoracic Aneurysm AAHI 2.33 Plan: Will Get CT Aorta Protocol  Christell Constant, MD

## 2020-05-09 NOTE — Telephone Encounter (Signed)
Reviewed results of echo and plan of care to get CT aorta. Patient verbalized understanding and agreement with plan and is aware someone will call him to schedule CT. He thanked me for the call.

## 2020-05-14 ENCOUNTER — Telehealth: Payer: Self-pay | Admitting: *Deleted

## 2020-05-14 NOTE — Telephone Encounter (Signed)
PA for sleep study sent to [employer] for a second time. The first time I sent it I got a message from them stating patient policy inactive. Resent it with new number provided to me by Coralee North.

## 2020-05-18 ENCOUNTER — Other Ambulatory Visit: Payer: Self-pay

## 2020-05-18 ENCOUNTER — Other Ambulatory Visit: Payer: PRIVATE HEALTH INSURANCE | Admitting: *Deleted

## 2020-05-18 DIAGNOSIS — I712 Thoracic aortic aneurysm, without rupture, unspecified: Secondary | ICD-10-CM

## 2020-05-18 DIAGNOSIS — I10 Essential (primary) hypertension: Secondary | ICD-10-CM

## 2020-05-18 LAB — BASIC METABOLIC PANEL
BUN/Creatinine Ratio: 14 (ref 9–20)
BUN: 14 mg/dL (ref 6–24)
CO2: 25 mmol/L (ref 20–29)
Calcium: 10.2 mg/dL (ref 8.7–10.2)
Chloride: 98 mmol/L (ref 96–106)
Creatinine, Ser: 0.98 mg/dL (ref 0.76–1.27)
Glucose: 111 mg/dL — ABNORMAL HIGH (ref 65–99)
Potassium: 4.2 mmol/L (ref 3.5–5.2)
Sodium: 140 mmol/L (ref 134–144)
eGFR: 93 mL/min/{1.73_m2} (ref >59)

## 2020-05-25 ENCOUNTER — Other Ambulatory Visit: Payer: Self-pay

## 2020-05-25 ENCOUNTER — Ambulatory Visit (INDEPENDENT_AMBULATORY_CARE_PROVIDER_SITE_OTHER)
Admission: RE | Admit: 2020-05-25 | Discharge: 2020-05-25 | Disposition: A | Discharge status: Home / Self Care | Payer: PRIVATE HEALTH INSURANCE | Source: Ambulatory Visit | Attending: Internal Medicine | Admitting: Internal Medicine

## 2020-05-25 DIAGNOSIS — I712 Thoracic aortic aneurysm, without rupture, unspecified: Secondary | ICD-10-CM

## 2020-05-25 MED ORDER — IOHEXOL 350 MG/ML SOLN
100.0000 mL | Freq: Once | INTRAVENOUS | Status: AC | PRN
  Administered 2020-05-25: 100 mL via INTRAVENOUS

## 2020-05-28 ENCOUNTER — Telehealth: Payer: Self-pay

## 2020-05-28 NOTE — Telephone Encounter (Signed)
Left a detailed message with results of CT of chest and MD recommendations.  Told to call the office with any questions or concerns.

## 2020-05-28 NOTE — Telephone Encounter (Signed)
-----   Message from Christell Constant, MD sent at 05/27/2020 12:19 PM EDT ----- Results: TAA is ULN Plan: No change   Christell Constant, MD

## 2020-05-30 ENCOUNTER — Telehealth: Payer: Self-pay | Admitting: *Deleted

## 2020-05-30 NOTE — Telephone Encounter (Signed)
Informed Frank Herrera that the SECOND insurance ID # the patient provided her with is also incorrect. He needs to bring [place] a card before we proceed with trying to precert his sleep study.

## 2020-05-30 NOTE — Telephone Encounter (Signed)
[employer].Marland KitchenMarland KitchenMEMBER ID # [id number] .Marland Kitchen POLICY.#.[id number].  This is in his chart.

## 2020-05-31 ENCOUNTER — Telehealth: Payer: Self-pay | Admitting: *Deleted

## 2020-05-31 NOTE — Telephone Encounter (Signed)
Telephoned [employer] for a 3rd time and was told the patient's insurance has been cancelled. Call reference # [id number].

## 2020-06-04 NOTE — Telephone Encounter (Signed)
Reached out to patient and left message to bring his insurance card to the office to be scanned. Burna Mortimer called again and was told patients insurance had cancelled.

## 2020-06-04 NOTE — Telephone Encounter (Signed)
RE: sleep study Gaynelle Cage, CMA  Reesa Chew, CMA I called [employer] AGAIN today and was told AGAIN that his policy has been cancelled. I am not calling anymore.     From: Reesa Chew, CMA  Sent: 05/30/2020  4:44 PM EDT  To: Gaynelle Cage, CMA  Subject: RE: sleep study                  Talked to patient today and he gave me these. [employer].Marland KitchenMarland KitchenMEMBER ID # [id number] .Marland Kitchen POLICY.#.[id number].  ----- Message -----  From: Gaynelle Cage, CMA  Sent: 05/04/2020  3:15 PM EDT  To: Reesa Chew, CMA  Subject: RE: sleep study                  Is it the same insurance company or a different one?  ----- Message -----  From: Reesa Chew, CMA  Sent: 05/04/2020 12:59 PM EST  To: Gaynelle Cage, CMA  Subject: RE: sleep study                  New for his insurance ID# [id number]  ----- Message -----  From: Gaynelle Cage, CMA  Sent: 04/26/2020 10:23 AM EST  To: Reesa Chew, CMA  Subject: RE: sleep study                  Per [employer] insurance patient no longer has coverage. Call patient to see if any other insurance and resend to pool .  ----- Message -----  From: Macie Burows, RN  Sent: 04/17/2020  9:52 AM EST  To: Reesa Chew, CMA, [facility] Sleep Studies  Subject: sleep study                    Dr. Izora Ribas has requested that this patient have a sleep study for daytime somnolence.

## 2020-06-11 NOTE — Telephone Encounter (Signed)
Patient called back to say his insurance has cancelled and he is waiting on a his new insurance cards.

## 2020-07-27 ENCOUNTER — Other Ambulatory Visit: Payer: PRIVATE HEALTH INSURANCE

## 2020-07-27 ENCOUNTER — Ambulatory Visit: Payer: No Typology Code available for payment source | Admitting: Internal Medicine

## 2020-07-27 NOTE — Telephone Encounter (Signed)
Patient is scheduled for lab study on 10-02-20. Patient understands his sleep study will be done at [facility] sleep lab Patient understands he will receive a sleep packet in a week or s. Ptient understands to call if he does not receive the sleep packet in a timely manner. Patient agrees with treatment and thanked me for call.

## 2020-08-06 ENCOUNTER — Other Ambulatory Visit: Payer: Self-pay | Admitting: Family Medicine

## 2020-08-06 DIAGNOSIS — E559 Vitamin D deficiency, unspecified: Secondary | ICD-10-CM

## 2020-09-24 ENCOUNTER — Other Ambulatory Visit: Payer: Self-pay | Admitting: Family Medicine

## 2020-09-24 DIAGNOSIS — E559 Vitamin D deficiency, unspecified: Secondary | ICD-10-CM

## 2020-10-02 ENCOUNTER — Encounter: Payer: Self-pay | Admitting: Family Medicine

## 2020-10-02 ENCOUNTER — Encounter (HOSPITAL_BASED_OUTPATIENT_CLINIC_OR_DEPARTMENT_OTHER): Payer: No Typology Code available for payment source | Admitting: Cardiology

## 2020-10-02 ENCOUNTER — Other Ambulatory Visit: Payer: Self-pay

## 2020-10-02 ENCOUNTER — Ambulatory Visit (INDEPENDENT_AMBULATORY_CARE_PROVIDER_SITE_OTHER): Payer: BLUE CROSS/BLUE SHIELD | Admitting: Family Medicine

## 2020-10-02 VITALS — BP 121/76 | HR 70 | Temp 98.2°F | Ht 70.0 in | Wt 283.4 lb

## 2020-10-02 DIAGNOSIS — N529 Male erectile dysfunction, unspecified: Secondary | ICD-10-CM | POA: Diagnosis not present

## 2020-10-02 DIAGNOSIS — E559 Vitamin D deficiency, unspecified: Secondary | ICD-10-CM

## 2020-10-02 DIAGNOSIS — I10 Essential (primary) hypertension: Secondary | ICD-10-CM | POA: Diagnosis not present

## 2020-10-02 DIAGNOSIS — Z114 Encounter for screening for human immunodeficiency virus [HIV]: Secondary | ICD-10-CM

## 2020-10-02 DIAGNOSIS — Z1159 Encounter for screening for other viral diseases: Secondary | ICD-10-CM

## 2020-10-02 DIAGNOSIS — M5416 Radiculopathy, lumbar region: Secondary | ICD-10-CM

## 2020-10-02 DIAGNOSIS — E785 Hyperlipidemia, unspecified: Secondary | ICD-10-CM

## 2020-10-02 NOTE — Progress Notes (Signed)
Subjective:  Patient ID: Frank Herrera, male    DOB: [birth year]  Age: 55 y.o. MRN: [id number]  CC: Medical Management of Chronic Issues   HPI Frank Herrera presents for  presents for  follow-up of hypertension. Patient has no history of headache chest pain or shortness of breath or recent cough. Patient also denies symptoms of TIA such as focal numbness or weakness. Patient denies side effects from medication. States taking it regularly.  Having pain radiating down the posterolateral right thigh. Right 2nd toe is numb. Has bilateral drop foot. Has to pull himself up stairs with his upper body to go up stairs due to weakness of legs. Feel a pinch and pressure.    Depression screen [facility] 2/9 10/02/2020 10/02/2020 04/05/2020  Decreased Interest 1 0 0  Down, Depressed, Hopeless 0 0 0  PHQ - 2 Score 1 0 0  Altered sleeping 1 - -  Tired, decreased energy 1 - -  Change in appetite 0 - -  Feeling bad or failure about yourself  0 - -  Trouble concentrating 1 - -  Moving slowly or fidgety/restless 0 - -  Suicidal thoughts 0 - -  PHQ-9 Score 4 - -  Difficult doing work/chores Not difficult at all - -    History Frank Herrera has a past medical history of Hypertension and Lead exposure (03/19/2015).   He has a past surgical history that includes Spine surgery (2010) and Hernia repair (1990).   His family history includes Heart disease in his father; Hypertension in his brother, father, mother, and sister; Kidney disease in his father.He reports that he has never smoked. He has never used smokeless tobacco. He reports current alcohol use. He reports that he does not use drugs.    ROS Review of Systems  Constitutional:  Negative for chills, diaphoresis and fever.  HENT:  Negative for sore throat.   Respiratory:  Negative for shortness of breath.   Cardiovascular:  Negative for chest pain.  Gastrointestinal:  Negative for abdominal pain.  Musculoskeletal:  Positive for arthralgias, back pain, gait  problem and myalgias. Negative for neck pain.  Skin:  Negative for rash.  Neurological:  Positive for weakness. Negative for numbness.   Objective:  BP 121/76   Pulse 70   Temp 98.2 F (36.8 C)   Ht 5\' 10"  (1.778 m)   Wt 283 lb 6.4 oz (128.5 kg)   SpO2 98%   BMI 40.66 kg/m   BP Readings from Last 3 Encounters:  10/02/20 121/76  04/17/20 (!) 140/100  04/05/20 (!) 140/93    Wt Readings from Last 3 Encounters:  10/02/20 283 lb 6.4 oz (128.5 kg)  04/17/20 289 lb (131.1 kg)  04/05/20 287 lb 3.2 oz (130.3 kg)     Physical Exam Vitals reviewed.  Constitutional:      General: He is in acute distress.     Appearance: He is well-developed.  HENT:     Head: Normocephalic.  Eyes:     Pupils: Pupils are equal, round, and reactive to light.  Cardiovascular:     Rate and Rhythm: Normal rate and regular rhythm.     Heart sounds: Normal heart sounds. No murmur heard. Pulmonary:     Effort: Pulmonary effort is normal.     Breath sounds: Normal breath sounds.  Abdominal:     Tenderness: There is no abdominal tenderness.  Musculoskeletal:        General: Tenderness present.     Cervical back: Normal  range of motion.  Skin:    General: Skin is warm and dry.  Neurological:     Mental Status: He is alert and oriented to person, place, and time.     Deep Tendon Reflexes: Reflexes are normal and symmetric.  Psychiatric:        Behavior: Behavior normal.        Thought Content: Thought content normal.      Assessment & Plan:   Frank Herrera was seen today for medical management of chronic issues.  Diagnoses and all orders for this visit:  Vitamin D deficiency -     VITAMIN D 25 Hydroxy (Vit-D Deficiency, Fractures)  Essential hypertension -     CBC with Differential/Platelet -     CMP14+EGFR  Erectile dysfunction, unspecified erectile dysfunction type -     Testosterone,Free and Total  Hyperlipidemia, unspecified hyperlipidemia type -     Lipid panel  Encounter for  screening for HIV -     HIV antibody (with reflex)  Encounter for hepatitis C screening test for low risk patient -     Hepatitis C antibody  Severe obesity (BMI >= 40) (HCC)  Lumbar radiculopathy, right -     MR Lumbar Spine Wo Contrast; Future      I am having Frank Herrera maintain his cyclobenzaprine, sildenafil, hydrochlorothiazide, allopurinol, amLODipine, celecoxib, atorvastatin, carvedilol, and Vitamin D (Ergocalciferol).  Allergies as of 10/02/2020   No Known Allergies      Medication List        Accurate as of October 02, 2020  9:33 AM. If you have any questions, ask your nurse or doctor.          allopurinol 100 MG tablet Commonly known as: ZYLOPRIM TAKE 2 TABLETS (200 MG TOTAL) BY MOUTH DAILY.   amLODipine 10 MG tablet Commonly known as: NORVASC TAKE 1 TABLET (10 MG TOTAL) BY MOUTH DAILY.for blood pressure   atorvastatin 20 MG tablet Commonly known as: LIPITOR Take 1 tablet (20 mg total) by mouth daily.   carvedilol 12.5 MG tablet Commonly known as: COREG Take 1 tablet (12.5 mg total) by mouth 2 (two) times daily.   celecoxib 200 MG capsule Commonly known as: CeleBREX Take 1 capsule (200 mg total) by mouth daily. With food   cyclobenzaprine 10 MG tablet Commonly known as: FLEXERIL Take 1 tablet (10 mg total) by mouth at bedtime. To relax muscles   hydrochlorothiazide 25 MG tablet Commonly known as: HYDRODIURIL TAKE 1 TABLET BY MOUTH EVERY DAY   sildenafil 20 MG tablet Commonly known as: REVATIO Take 2-5 pills at once, orally, with each sexual encounter   Vitamin D (Ergocalciferol) 1.25 MG (50000 UNIT) Caps capsule Commonly known as: DRISDOL TAKE 1 CAPSULE (50,000 UNITS TOTAL) BY MOUTH EVERY 7 (SEVEN) DAYS         Follow-up: Return in about 2 weeks (around 10/16/2020) for back pain, Review MRI result.  [name], M.D.

## 2020-10-03 LAB — CBC WITH DIFFERENTIAL/PLATELET
Basophils Absolute: 0 10*3/uL (ref 0.0–0.2)
Basos: 1 %
EOS (ABSOLUTE): 0.2 10*3/uL (ref 0.0–0.4)
Eos: 3 %
Hematocrit: 39.1 % (ref 37.5–51.0)
Hemoglobin: 13.3 g/dL (ref 13.0–17.7)
Immature Grans (Abs): 0 10*3/uL (ref 0.0–0.1)
Immature Granulocytes: 0 %
Lymphocytes Absolute: 1.6 10*3/uL (ref 0.7–3.1)
Lymphs: 32 %
MCH: 30 pg (ref 26.6–33.0)
MCHC: 34 g/dL (ref 31.5–35.7)
MCV: 88 fL (ref 79–97)
Monocytes Absolute: 0.5 10*3/uL (ref 0.1–0.9)
Monocytes: 10 %
Neutrophils Absolute: 2.8 10*3/uL (ref 1.4–7.0)
Neutrophils: 54 %
Platelets: 279 10*3/uL (ref 150–450)
RBC: 4.43 x10E6/uL (ref 4.14–5.80)
RDW: 11.9 % (ref 11.6–15.4)
WBC: 5.2 10*3/uL (ref 3.4–10.8)

## 2020-10-03 LAB — CMP14+EGFR
ALT: 27 IU/L (ref 0–44)
AST: 28 IU/L (ref 0–40)
Albumin/Globulin Ratio: 1.6 (ref 1.2–2.2)
Albumin: 4.5 g/dL (ref 3.8–4.9)
Alkaline Phosphatase: 100 IU/L (ref 44–121)
BUN/Creatinine Ratio: 12 (ref 9–20)
BUN: 11 mg/dL (ref 6–24)
Bilirubin Total: 0.6 mg/dL (ref 0.0–1.2)
CO2: 25 mmol/L (ref 20–29)
Calcium: 9.9 mg/dL (ref 8.7–10.2)
Chloride: 98 mmol/L (ref 96–106)
Creatinine, Ser: 0.95 mg/dL (ref 0.76–1.27)
Globulin, Total: 2.8 g/dL (ref 1.5–4.5)
Glucose: 109 mg/dL — ABNORMAL HIGH (ref 65–99)
Potassium: 4.2 mmol/L (ref 3.5–5.2)
Sodium: 138 mmol/L (ref 134–144)
Total Protein: 7.3 g/dL (ref 6.0–8.5)
eGFR: 96 mL/min/{1.73_m2} (ref >59)

## 2020-10-03 LAB — LIPID PANEL
Chol/HDL Ratio: 2.6 ratio (ref 0.0–5.0)
Cholesterol, Total: 125 mg/dL (ref 100–199)
HDL: 49 mg/dL (ref >39)
LDL Chol Calc (NIH): 59 mg/dL (ref 0–99)
Triglycerides: 86 mg/dL (ref 0–149)
VLDL Cholesterol Cal: 17 mg/dL (ref 5–40)

## 2020-10-03 LAB — VITAMIN D 25 HYDROXY (VIT D DEFICIENCY, FRACTURES): Vit D, 25-Hydroxy: 43.5 ng/mL (ref 30.0–100.0)

## 2020-10-03 LAB — HIV ANTIBODY (ROUTINE TESTING W REFLEX): HIV Screen 4th Generation wRfx: NONREACTIVE

## 2020-10-03 LAB — TESTOSTERONE,FREE AND TOTAL
Testosterone, Free: 11.5 pg/mL (ref 7.2–24.0)
Testosterone: 448 ng/dL (ref 264–916)

## 2020-10-03 LAB — HEPATITIS C ANTIBODY: Hep C Virus Ab: <0.1 s/co ratio (ref 0.0–0.9)

## 2020-10-03 NOTE — Progress Notes (Signed)
Hello Frank Herrera,  Your lab result is normal and/or stable.Some minor variations that are not significant are commonly marked abnormal, but do not represent any medical problem for you.  Best regards, Jabarie Pop, M.D.

## 2020-10-11 ENCOUNTER — Telehealth: Payer: Self-pay | Admitting: Family Medicine

## 2020-10-12 ENCOUNTER — Other Ambulatory Visit: Payer: Self-pay | Admitting: Family Medicine

## 2020-10-12 DIAGNOSIS — M21371 Foot drop, right foot: Secondary | ICD-10-CM

## 2020-10-12 NOTE — Telephone Encounter (Signed)
Pt would like to have a referral put in for ortho since the MRI was denied

## 2020-10-12 NOTE — Telephone Encounter (Signed)
Referral placed, as requested [facility] 

## 2020-10-17 ENCOUNTER — Ambulatory Visit: Payer: BLUE CROSS/BLUE SHIELD | Admitting: Family Medicine

## 2020-10-20 ENCOUNTER — Other Ambulatory Visit: Payer: Self-pay | Admitting: Family Medicine

## 2020-10-22 NOTE — Telephone Encounter (Signed)
Pt called stating that he received a call from Sports Medicine facility that Dr Darlyn Read referred him to, and was told that they only do shoulders and knees.  Need to send referral somewhere else and call patient with new referral info per sports medicine and patient request.

## 2020-10-23 ENCOUNTER — Other Ambulatory Visit: Payer: Self-pay | Admitting: Family Medicine

## 2020-10-23 DIAGNOSIS — N529 Male erectile dysfunction, unspecified: Secondary | ICD-10-CM

## 2020-10-24 ENCOUNTER — Encounter: Payer: Self-pay | Admitting: Family Medicine

## 2020-10-24 NOTE — Telephone Encounter (Signed)
Last office visit 09/12/20 Refilled 04/05/20, #50, 5 refills

## 2020-10-29 ENCOUNTER — Ambulatory Visit: Payer: No Typology Code available for payment source | Admitting: Family Medicine

## 2021-01-29 ENCOUNTER — Telehealth: Payer: Self-pay

## 2021-01-29 NOTE — Telephone Encounter (Signed)
Letter has been sent to patient informing them that their sleep study has expired. Patient will need to call and schedule an office visit to re-evaluate the need for a sleep study.    

## 2021-02-06 ENCOUNTER — Other Ambulatory Visit: Payer: Self-pay | Admitting: Family Medicine

## 2021-02-06 DIAGNOSIS — I10 Essential (primary) hypertension: Secondary | ICD-10-CM

## 2021-02-06 NOTE — Telephone Encounter (Signed)
Stacks. NTBS in Jan for 6 mos ckup. Refill sent to pharmacy

## 2021-02-07 ENCOUNTER — Encounter: Payer: Self-pay | Admitting: Family Medicine

## 2021-02-07 NOTE — Telephone Encounter (Signed)
Called and left message to call back and set up appt. Letter sent.

## 2021-03-13 ENCOUNTER — Telehealth: Payer: Self-pay | Admitting: Family Medicine

## 2021-03-13 DIAGNOSIS — I10 Essential (primary) hypertension: Secondary | ICD-10-CM

## 2021-03-13 MED ORDER — AMLODIPINE BESYLATE 10 MG PO TABS: ORAL_TABLET | ORAL | 0 refills | Status: DC

## 2021-03-13 NOTE — Telephone Encounter (Signed)
Pt aware refill sent to pharmacy 

## 2021-03-13 NOTE — Telephone Encounter (Signed)
°  Prescription Request  03/13/2021  Is this a "Controlled Substance" medicine? NO  Have you seen your PCP in the last 2 weeks? NO  If YES, route message to pool  -  If NO, patient needs to be scheduled for appointment.  What is the name of the medication or equipment? Amlodipine 10 mg  Have you contacted your pharmacy to request a refill? yes   Which pharmacy would you like this sent to? [facility]   Patient notified that their request is being sent to the clinical staff for review and that they should receive a response within 2 business days.

## 2021-03-14 ENCOUNTER — Other Ambulatory Visit: Payer: Self-pay | Admitting: Family Medicine

## 2021-03-14 DIAGNOSIS — M1000 Idiopathic gout, unspecified site: Secondary | ICD-10-CM

## 2021-03-18 ENCOUNTER — Ambulatory Visit (INDEPENDENT_AMBULATORY_CARE_PROVIDER_SITE_OTHER): Payer: BLUE CROSS/BLUE SHIELD | Admitting: Family Medicine

## 2021-03-18 ENCOUNTER — Encounter: Payer: Self-pay | Admitting: Family Medicine

## 2021-03-18 VITALS — BP 130/71 | HR 75 | Temp 98.3°F | Ht 70.0 in | Wt 293.8 lb

## 2021-03-18 DIAGNOSIS — M1000 Idiopathic gout, unspecified site: Secondary | ICD-10-CM | POA: Diagnosis not present

## 2021-03-18 DIAGNOSIS — N529 Male erectile dysfunction, unspecified: Secondary | ICD-10-CM

## 2021-03-18 DIAGNOSIS — E559 Vitamin D deficiency, unspecified: Secondary | ICD-10-CM

## 2021-03-18 DIAGNOSIS — Z23 Encounter for immunization: Secondary | ICD-10-CM

## 2021-03-18 DIAGNOSIS — M5416 Radiculopathy, lumbar region: Secondary | ICD-10-CM | POA: Diagnosis not present

## 2021-03-18 DIAGNOSIS — I10 Essential (primary) hypertension: Secondary | ICD-10-CM

## 2021-03-18 DIAGNOSIS — E785 Hyperlipidemia, unspecified: Secondary | ICD-10-CM

## 2021-03-18 MED ORDER — HYDROCHLOROTHIAZIDE 25 MG PO TABS: 25.0000 mg | ORAL_TABLET | Freq: Every day | ORAL | 3 refills | Status: DC

## 2021-03-18 MED ORDER — SILDENAFIL CITRATE 20 MG PO TABS: ORAL_TABLET | ORAL | 5 refills | Status: DC

## 2021-03-18 MED ORDER — AMLODIPINE BESYLATE 10 MG PO TABS: ORAL_TABLET | ORAL | 2 refills | Status: DC

## 2021-03-18 MED ORDER — ATORVASTATIN CALCIUM 20 MG PO TABS: 20.0000 mg | ORAL_TABLET | Freq: Every day | ORAL | 3 refills | Status: DC

## 2021-03-18 MED ORDER — ALLOPURINOL 100 MG PO TABS: ORAL_TABLET | ORAL | 2 refills | Status: DC

## 2021-03-18 MED ORDER — CELECOXIB 400 MG PO CAPS: 400.0000 mg | ORAL_CAPSULE | Freq: Every day | ORAL | 3 refills | Status: DC

## 2021-03-18 MED ORDER — CARVEDILOL 12.5 MG PO TABS: 12.5000 mg | ORAL_TABLET | Freq: Two times a day (BID) | ORAL | 3 refills | Status: DC

## 2021-03-18 MED ORDER — VITAMIN D (ERGOCALCIFEROL) 1.25 MG (50000 UNIT) PO CAPS: 50000.0000 [IU] | ORAL_CAPSULE | ORAL | 3 refills | Status: DC

## 2021-03-18 NOTE — Addendum Note (Signed)
Addended by: Adella Hare B on: 03/18/2021 04:25 PM   Modules accepted: Orders

## 2021-03-18 NOTE — Progress Notes (Signed)
Subjective:  Patient ID: Frank Herrera, male    DOB: Mar 08, 1966  Age: 56 y.o. MRN: [id number]  CC: Medical Management of Chronic Issues   HPI Frank Herrera presents for  follow-up of hypertension. Patient has no history of headache chest pain or shortness of breath or recent cough. Patient also denies symptoms of TIA such as focal numbness or weakness. Patient denies side effects from medication. States taking it regularly.  in for follow-up of elevated cholesterol. Doing well without complaints on current medication. Denies side effects of statin including myalgia and arthralgia and nausea. Currently no chest pain, shortness of breath or other cardiovascular related symptoms noted.   History Frank Herrera has a past medical history of Hypertension and Lead exposure (03/19/2015).   He has a past surgical history that includes Spine surgery (2010) and Hernia repair (1990).   His family history includes Heart disease in his father; Hypertension in his brother, father, mother, and sister; Kidney disease in his father.He reports that he has never smoked. He has never used smokeless tobacco. He reports current alcohol use. He reports that he does not use drugs.  No current outpatient medications on file prior to visit.   No current facility-administered medications on file prior to visit.    ROS Review of Systems  Constitutional:  Negative for fever.  Respiratory:  Negative for shortness of breath.   Cardiovascular:  Negative for chest pain.  Musculoskeletal:  Negative for arthralgias.  Skin:  Negative for rash.   Objective:  BP 130/71    Pulse 75    Temp 98.3 F (36.8 C)    Ht $R'5\' 10"'xL$  (1.778 m)    Wt 293 lb 12.8 oz (133.3 kg)    SpO2 97%    BMI 42.16 kg/m   BP Readings from Last 3 Encounters:  03/18/21 130/71  10/02/20 121/76  04/17/20 (!) 140/100    Wt Readings from Last 3 Encounters:  03/18/21 293 lb 12.8 oz (133.3 kg)  10/02/20 283 lb 6.4 oz (128.5 kg)  04/17/20 289 lb (131.1  kg)     Physical Exam Vitals reviewed.  Constitutional:      General: He is in acute distress.     Appearance: He is well-developed.  HENT:     Head: Normocephalic and atraumatic.     Right Ear: External ear normal.     Left Ear: External ear normal.     Mouth/Throat:     Pharynx: No oropharyngeal exudate or posterior oropharyngeal erythema.  Eyes:     Pupils: Pupils are equal, round, and reactive to light.  Cardiovascular:     Rate and Rhythm: Normal rate and regular rhythm.     Heart sounds: Normal heart sounds. No murmur heard. Pulmonary:     Effort: Pulmonary effort is normal. No respiratory distress.     Breath sounds: Normal breath sounds.  Abdominal:     Tenderness: There is no abdominal tenderness.  Musculoskeletal:        General: Tenderness (bilateral, lumbar) present.     Cervical back: Normal range of motion and neck supple.  Skin:    General: Skin is warm and dry.  Neurological:     Mental Status: He is alert and oriented to person, place, and time.     Deep Tendon Reflexes: Reflexes are normal and symmetric.  Psychiatric:        Behavior: Behavior normal.        Thought Content: Thought content normal.  Assessment & Plan:   Frank Herrera was seen today for medical management of chronic issues.  Diagnoses and all orders for this visit:  Lumbar radiculopathy, right -     celecoxib (CELEBREX) 400 MG capsule; Take 1 capsule (400 mg total) by mouth daily. With food -     CBC with Differential/Platelet  Idiopathic gout, unspecified chronicity, unspecified site -     allopurinol (ZYLOPRIM) 100 MG tablet; TAKE 2 TABLETS BY MOUTH EVERY DAY -     CBC with Differential/Platelet -     Uric acid  Essential hypertension -     amLODipine (NORVASC) 10 MG tablet; TAKE 1 TABLET (10 MG TOTAL) BY MOUTH DAILY.for blood pressure -     hydrochlorothiazide (HYDRODIURIL) 25 MG tablet; Take 1 tablet (25 mg total) by mouth daily. -     CBC with  Differential/Platelet  Erectile dysfunction, unspecified erectile dysfunction type -     sildenafil (REVATIO) 20 MG tablet; TAKE 2-5 PILLS AT ONCE, ORALLY, WITH EACH SEXUAL ENCOUNTER -     CBC with Differential/Platelet  Vitamin D deficiency -     Vitamin D, Ergocalciferol, (DRISDOL) 1.25 MG (50000 UNIT) CAPS capsule; Take 1 capsule (50,000 Units total) by mouth every 7 (seven) days. -     CBC with Differential/Platelet -     VITAMIN D 25 Hydroxy (Vit-D Deficiency, Fractures)  Hyperlipidemia, unspecified hyperlipidemia type -     CMP14+EGFR -     Lipid panel  Other orders -     carvedilol (COREG) 12.5 MG tablet; Take 1 tablet (12.5 mg total) by mouth 2 (two) times daily. -     atorvastatin (LIPITOR) 20 MG tablet; Take 1 tablet (20 mg total) by mouth daily.   Allergies as of 03/18/2021   No Known Allergies      Medication List        Accurate as of March 18, 2021  4:06 PM. If you have any questions, ask your nurse or doctor.          STOP taking these medications    cyclobenzaprine 10 MG tablet Commonly known as: FLEXERIL Stopped by: [name], MD       TAKE these medications    allopurinol 100 MG tablet Commonly known as: ZYLOPRIM TAKE 2 TABLETS BY MOUTH EVERY DAY   amLODipine 10 MG tablet Commonly known as: NORVASC TAKE 1 TABLET (10 MG TOTAL) BY MOUTH DAILY.for blood pressure   atorvastatin 20 MG tablet Commonly known as: LIPITOR Take 1 tablet (20 mg total) by mouth daily.   carvedilol 12.5 MG tablet Commonly known as: COREG Take 1 tablet (12.5 mg total) by mouth 2 (two) times daily.   celecoxib 400 MG capsule Commonly known as: CELEBREX Take 1 capsule (400 mg total) by mouth daily. With food What changed:  medication strength how much to take Changed by: [name], MD   hydrochlorothiazide 25 MG tablet Commonly known as: HYDRODIURIL Take 1 tablet (25 mg total) by mouth daily.   sildenafil 20 MG tablet Commonly known as: REVATIO TAKE  2-5 PILLS AT ONCE, ORALLY, WITH EACH SEXUAL ENCOUNTER   Vitamin D (Ergocalciferol) 1.25 MG (50000 UNIT) Caps capsule Commonly known as: DRISDOL Take 1 capsule (50,000 Units total) by mouth every 7 (seven) days.        Meds ordered this encounter  Medications   allopurinol (ZYLOPRIM) 100 MG tablet    Sig: TAKE 2 TABLETS BY MOUTH EVERY DAY    Dispense:  180 tablet  Refill:  2   amLODipine (NORVASC) 10 MG tablet    Sig: TAKE 1 TABLET (10 MG TOTAL) BY MOUTH DAILY.for blood pressure    Dispense:  90 tablet    Refill:  2   hydrochlorothiazide (HYDRODIURIL) 25 MG tablet    Sig: Take 1 tablet (25 mg total) by mouth daily.    Dispense:  90 tablet    Refill:  3   celecoxib (CELEBREX) 400 MG capsule    Sig: Take 1 capsule (400 mg total) by mouth daily. With food    Dispense:  90 capsule    Refill:  3   carvedilol (COREG) 12.5 MG tablet    Sig: Take 1 tablet (12.5 mg total) by mouth 2 (two) times daily.    Dispense:  180 tablet    Refill:  3   sildenafil (REVATIO) 20 MG tablet    Sig: TAKE 2-5 PILLS AT ONCE, ORALLY, WITH EACH SEXUAL ENCOUNTER    Dispense:  50 tablet    Refill:  5   atorvastatin (LIPITOR) 20 MG tablet    Sig: Take 1 tablet (20 mg total) by mouth daily.    Dispense:  90 tablet    Refill:  3   Vitamin D, Ergocalciferol, (DRISDOL) 1.25 MG (50000 UNIT) CAPS capsule    Sig: Take 1 capsule (50,000 Units total) by mouth every 7 (seven) days.    Dispense:  13 capsule    Refill:  3      Follow-up: Return in about 6 months (around 09/15/2021).  [name], M.D.

## 2021-03-19 LAB — CMP14+EGFR
ALT: 34 IU/L (ref 0–44)
AST: 36 IU/L (ref 0–40)
Albumin/Globulin Ratio: 1.9 (ref 1.2–2.2)
Albumin: 4.8 g/dL (ref 3.8–4.9)
Alkaline Phosphatase: 105 IU/L (ref 44–121)
BUN/Creatinine Ratio: 13 (ref 9–20)
BUN: 12 mg/dL (ref 6–24)
Bilirubin Total: 0.7 mg/dL (ref 0.0–1.2)
CO2: 24 mmol/L (ref 20–29)
Calcium: 9.9 mg/dL (ref 8.7–10.2)
Chloride: 102 mmol/L (ref 96–106)
Creatinine, Ser: 0.93 mg/dL (ref 0.76–1.27)
Globulin, Total: 2.5 g/dL (ref 1.5–4.5)
Glucose: 109 mg/dL — ABNORMAL HIGH (ref 70–99)
Potassium: 3.8 mmol/L (ref 3.5–5.2)
Sodium: 142 mmol/L (ref 134–144)
Total Protein: 7.3 g/dL (ref 6.0–8.5)
eGFR: 98 mL/min/{1.73_m2} (ref >59)

## 2021-03-19 LAB — LIPID PANEL
Chol/HDL Ratio: 2.3 ratio (ref 0.0–5.0)
Cholesterol, Total: 115 mg/dL (ref 100–199)
HDL: 50 mg/dL (ref >39)
LDL Chol Calc (NIH): 51 mg/dL (ref 0–99)
Triglycerides: 69 mg/dL (ref 0–149)
VLDL Cholesterol Cal: 14 mg/dL (ref 5–40)

## 2021-03-19 LAB — CBC WITH DIFFERENTIAL/PLATELET
Basophils Absolute: 0 10*3/uL (ref 0.0–0.2)
Basos: 1 %
EOS (ABSOLUTE): 0.1 10*3/uL (ref 0.0–0.4)
Eos: 3 %
Hematocrit: 40.1 % (ref 37.5–51.0)
Hemoglobin: 13.3 g/dL (ref 13.0–17.7)
Immature Grans (Abs): 0 10*3/uL (ref 0.0–0.1)
Immature Granulocytes: 0 %
Lymphocytes Absolute: 1.4 10*3/uL (ref 0.7–3.1)
Lymphs: 33 %
MCH: 29.6 pg (ref 26.6–33.0)
MCHC: 33.2 g/dL (ref 31.5–35.7)
MCV: 89 fL (ref 79–97)
Monocytes Absolute: 0.5 10*3/uL (ref 0.1–0.9)
Monocytes: 10 %
Neutrophils Absolute: 2.4 10*3/uL (ref 1.4–7.0)
Neutrophils: 53 %
Platelets: 296 10*3/uL (ref 150–450)
RBC: 4.5 x10E6/uL (ref 4.14–5.80)
RDW: 12 % (ref 11.6–15.4)
WBC: 4.4 10*3/uL (ref 3.4–10.8)

## 2021-03-19 LAB — URIC ACID: Uric Acid: 6.2 mg/dL (ref 3.8–8.4)

## 2021-03-19 LAB — VITAMIN D 25 HYDROXY (VIT D DEFICIENCY, FRACTURES): Vit D, 25-Hydroxy: 43.2 ng/mL (ref 30.0–100.0)

## 2021-03-22 NOTE — Progress Notes (Signed)
Hello Frank Herrera,  Your lab result is normal and/or stable.Some minor variations that are not significant are commonly marked abnormal, but do not represent any medical problem for you.  Best regards, Abbegayle Denault, M.D.

## 2021-09-16 ENCOUNTER — Ambulatory Visit (INDEPENDENT_AMBULATORY_CARE_PROVIDER_SITE_OTHER): Payer: Medicare HMO | Admitting: Family Medicine

## 2021-09-16 ENCOUNTER — Encounter: Payer: Self-pay | Admitting: Family Medicine

## 2021-09-16 VITALS — BP 143/89 | HR 76 | Temp 98.2°F | Ht 70.0 in | Wt 291.8 lb

## 2021-09-16 DIAGNOSIS — I10 Essential (primary) hypertension: Secondary | ICD-10-CM

## 2021-09-16 DIAGNOSIS — Z23 Encounter for immunization: Secondary | ICD-10-CM | POA: Diagnosis not present

## 2021-09-16 DIAGNOSIS — N529 Male erectile dysfunction, unspecified: Secondary | ICD-10-CM | POA: Diagnosis not present

## 2021-09-16 DIAGNOSIS — M1000 Idiopathic gout, unspecified site: Secondary | ICD-10-CM

## 2021-09-16 DIAGNOSIS — M5416 Radiculopathy, lumbar region: Secondary | ICD-10-CM

## 2021-09-16 LAB — BMP8+EGFR
BUN/Creatinine Ratio: 18 (ref 9–20)
BUN: 15 mg/dL (ref 6–24)
CO2: 24 mmol/L (ref 20–29)
Calcium: 9.7 mg/dL (ref 8.7–10.2)
Chloride: 100 mmol/L (ref 96–106)
Creatinine, Ser: 0.85 mg/dL (ref 0.76–1.27)
Glucose: 121 mg/dL — ABNORMAL HIGH (ref 70–99)
Potassium: 3.8 mmol/L (ref 3.5–5.2)
Sodium: 139 mmol/L (ref 134–144)
eGFR: 103 mL/min/{1.73_m2} (ref >59)

## 2021-09-16 MED ORDER — SILDENAFIL CITRATE 20 MG PO TABS: ORAL_TABLET | ORAL | 5 refills | Status: AC

## 2021-09-16 NOTE — Progress Notes (Signed)
Subjective:  Patient ID: Frank Herrera, male    DOB: 11/27/1965  Age: 56 y.o. MRN: [id number]  CC: Medical Management of Chronic Issues   HPI [name] presents for  follow-up of hypertension. Patient has no history of headache chest pain or shortness of breath or recent cough. Patient also denies symptoms of TIA such as focal numbness or weakness. Patient denies side effects from medication. States taking it regularly.Home readings running 134/80.    History Frank Herrera has a past medical history of Hypertension and Lead exposure (03/19/2015).   He has a past surgical history that includes Spine surgery (2010) and Hernia repair (1990).   His family history includes Heart disease in his father; Hypertension in his brother, father, mother, and sister; Kidney disease in his father.He reports that he has never smoked. He has never used smokeless tobacco. He reports current alcohol use. He reports that he does not use drugs.  Current Outpatient Medications on File Prior to Visit  Medication Sig Dispense Refill   allopurinol (ZYLOPRIM) 100 MG tablet TAKE 2 TABLETS BY MOUTH EVERY DAY 180 tablet 2   amLODipine (NORVASC) 10 MG tablet TAKE 1 TABLET (10 MG TOTAL) BY MOUTH DAILY.for blood pressure 90 tablet 2   atorvastatin (LIPITOR) 20 MG tablet Take 1 tablet (20 mg total) by mouth daily. 90 tablet 3   carvedilol (COREG) 12.5 MG tablet Take 1 tablet (12.5 mg total) by mouth 2 (two) times daily. 180 tablet 3   celecoxib (CELEBREX) 400 MG capsule Take 1 capsule (400 mg total) by mouth daily. With food 90 capsule 3   hydrochlorothiazide (HYDRODIURIL) 25 MG tablet Take 1 tablet (25 mg total) by mouth daily. 90 tablet 3   Vitamin D, Ergocalciferol, (DRISDOL) 1.25 MG (50000 UNIT) CAPS capsule Take 1 capsule (50,000 Units total) by mouth every 7 (seven) days. 13 capsule 3   No current facility-administered medications on file prior to visit.    ROS Review of Systems  Constitutional:  Negative for  fever.  Respiratory:  Negative for shortness of breath.   Cardiovascular:  Negative for chest pain.  Musculoskeletal:  Positive for arthralgias (now on disability due to back. Has drop foot bilaterally,). Negative for joint swelling.  Skin:  Negative for rash.    Objective:  BP (!) 143/89   Pulse 76   Temp 98.2 F (36.8 C)   Ht $R'5\' 10"'oB$  (1.778 m)   Wt 291 lb 12.8 oz (132.4 kg)   SpO2 97%   BMI 41.87 kg/m   BP Readings from Last 3 Encounters:  09/16/21 (!) 143/89  03/18/21 130/71  10/02/20 121/76    Wt Readings from Last 3 Encounters:  09/16/21 291 lb 12.8 oz (132.4 kg)  03/18/21 293 lb 12.8 oz (133.3 kg)  10/02/20 283 lb 6.4 oz (128.5 kg)     Physical Exam Vitals reviewed.  Constitutional:      Appearance: He is well-developed.  HENT:     Head: Normocephalic and atraumatic.     Right Ear: External ear normal.     Left Ear: External ear normal.     Mouth/Throat:     Pharynx: No oropharyngeal exudate or posterior oropharyngeal erythema.  Eyes:     Pupils: Pupils are equal, round, and reactive to light.  Cardiovascular:     Rate and Rhythm: Normal rate and regular rhythm.     Heart sounds: No murmur heard. Pulmonary:     Effort: No respiratory distress.     Breath sounds:  Normal breath sounds.  Musculoskeletal:     Cervical back: Normal range of motion and neck supple.  Neurological:     Mental Status: He is alert and oriented to person, place, and time.       Assessment & Plan:   Frank Herrera was seen today for medical management of chronic issues.  Diagnoses and all orders for this visit:  Essential hypertension  Severe obesity (BMI >= 40) (HCC)  Idiopathic gout, unspecified chronicity, unspecified site  Lumbar radiculopathy, right -     BMP8+EGFR  Erectile dysfunction, unspecified erectile dysfunction type -     sildenafil (REVATIO) 20 MG tablet; TAKE 2-5 PILLS AT ONCE, ORALLY, WITH EACH SEXUAL ENCOUNTER   Allergies as of 09/16/2021   No Known  Allergies      Medication List        Accurate as of September 16, 2021  8:38 AM. If you have any questions, ask your nurse or doctor.          allopurinol 100 MG tablet Commonly known as: ZYLOPRIM TAKE 2 TABLETS BY MOUTH EVERY DAY   amLODipine 10 MG tablet Commonly known as: NORVASC TAKE 1 TABLET (10 MG TOTAL) BY MOUTH DAILY.for blood pressure   atorvastatin 20 MG tablet Commonly known as: LIPITOR Take 1 tablet (20 mg total) by mouth daily.   carvedilol 12.5 MG tablet Commonly known as: COREG Take 1 tablet (12.5 mg total) by mouth 2 (two) times daily.   celecoxib 400 MG capsule Commonly known as: CELEBREX Take 1 capsule (400 mg total) by mouth daily. With food   hydrochlorothiazide 25 MG tablet Commonly known as: HYDRODIURIL Take 1 tablet (25 mg total) by mouth daily.   sildenafil 20 MG tablet Commonly known as: REVATIO TAKE 2-5 PILLS AT ONCE, ORALLY, WITH EACH SEXUAL ENCOUNTER   Vitamin D (Ergocalciferol) 1.25 MG (50000 UNIT) Caps capsule Commonly known as: DRISDOL Take 1 capsule (50,000 Units total) by mouth every 7 (seven) days.        Meds ordered this encounter  Medications   sildenafil (REVATIO) 20 MG tablet    Sig: TAKE 2-5 PILLS AT ONCE, ORALLY, WITH EACH SEXUAL ENCOUNTER    Dispense:  50 tablet    Refill:  5      Follow-up: Return in about 6 weeks (around 10/28/2021) for Welcome to Medicare.  [name], M.D.

## 2021-09-16 NOTE — Addendum Note (Signed)
Addended by: Adella Hare B on: 09/16/2021 09:16 AM   Modules accepted: Orders

## 2021-09-17 NOTE — Progress Notes (Signed)
Hello Paula,  Your lab result is normal and/or stable.Some minor variations that are not significant are commonly marked abnormal, but do not represent any medical problem for you.  Best regards, Tiara Bartoli, M.D.

## 2021-10-28 ENCOUNTER — Encounter: Payer: Self-pay | Admitting: Family Medicine

## 2021-10-28 ENCOUNTER — Ambulatory Visit (INDEPENDENT_AMBULATORY_CARE_PROVIDER_SITE_OTHER): Payer: Medicare HMO | Admitting: Family Medicine

## 2021-10-28 VITALS — BP 135/84 | HR 86 | Temp 98.3°F | Ht 70.0 in | Wt 287.0 lb

## 2021-10-28 DIAGNOSIS — Z0000 Encounter for general adult medical examination without abnormal findings: Secondary | ICD-10-CM | POA: Diagnosis not present

## 2021-10-28 DIAGNOSIS — I10 Essential (primary) hypertension: Secondary | ICD-10-CM | POA: Diagnosis not present

## 2021-10-28 DIAGNOSIS — N400 Enlarged prostate without lower urinary tract symptoms: Secondary | ICD-10-CM

## 2021-10-28 DIAGNOSIS — E559 Vitamin D deficiency, unspecified: Secondary | ICD-10-CM

## 2021-10-28 DIAGNOSIS — M21371 Foot drop, right foot: Secondary | ICD-10-CM

## 2021-10-28 DIAGNOSIS — E782 Mixed hyperlipidemia: Secondary | ICD-10-CM | POA: Diagnosis not present

## 2021-10-28 DIAGNOSIS — M21372 Foot drop, left foot: Secondary | ICD-10-CM | POA: Diagnosis not present

## 2021-10-28 NOTE — Progress Notes (Signed)
Annual Wellness Visit     Patient: Frank Herrera, Male    DOB: 05-13-1965, 56 y.o.   MRN: [id number]  Subjective  Chief Complaint  Patient presents with   Welcome to Medicare    Frank Herrera is a 56 y.o. male who presents today for his Annual Wellness Visit. He reports consuming a general diet. Exercise is limited by orthopedic condition(s): drop foot, bilateral. He generally feels fairly well. He reports sleeping well. He does not have additional problems to discuss today.   HPI       Medications: Outpatient Medications Prior to Visit  Medication Sig   allopurinol (ZYLOPRIM) 100 MG tablet TAKE 2 TABLETS BY MOUTH EVERY DAY   amLODipine (NORVASC) 10 MG tablet TAKE 1 TABLET (10 MG TOTAL) BY MOUTH DAILY.for blood pressure   atorvastatin (LIPITOR) 20 MG tablet Take 1 tablet (20 mg total) by mouth daily.   carvedilol (COREG) 12.5 MG tablet Take 1 tablet (12.5 mg total) by mouth 2 (two) times daily.   celecoxib (CELEBREX) 400 MG capsule Take 1 capsule (400 mg total) by mouth daily. With food   hydrochlorothiazide (HYDRODIURIL) 25 MG tablet Take 1 tablet (25 mg total) by mouth daily.   sildenafil (REVATIO) 20 MG tablet TAKE 2-5 PILLS AT ONCE, ORALLY, WITH EACH SEXUAL ENCOUNTER   Vitamin D, Ergocalciferol, (DRISDOL) 1.25 MG (50000 UNIT) CAPS capsule Take 1 capsule (50,000 Units total) by mouth every 7 (seven) days.   No facility-administered medications prior to visit.    No Known Allergies  Patient Care Team: [name], MD as PCP - General (Family Medicine) Michael Boston, MD as Consulting Physician (General Surgery)  ROS      Objective  BP 135/84   Pulse 86   Temp 98.3 F (36.8 C)   Ht $R'5\' 10"'RU$  (1.778 m)   Wt 287 lb (130.2 kg)   SpO2 97%   BMI 41.18 kg/m    Physical Exam    Most recent functional status assessment:    10/28/2021    1:05 PM  In your present state of health, do you have any difficulty performing the following activities:  Hearing? 0   Vision? 0  Difficulty concentrating or making decisions? 0  Walking or climbing stairs? 1  Comment drop foot, ambulates with cane  Dressing or bathing? 0  Doing errands, shopping? 0   Most recent fall risk assessment:    10/28/2021    1:02 PM  [place] in the past year? 1  Number falls in past yr: 1  Injury with Fall? 0  Risk for fall due to : History of fall(s);Impaired balance/gait;Impaired mobility;Orthopedic patient  Follow up Falls evaluation completed    Most recent depression screenings:    10/28/2021    1:02 PM 09/16/2021    8:11 AM  PHQ 2/9 Scores  PHQ - 2 Score 0 1  PHQ- 9 Score  3   Most recent cognitive screening:    10/28/2021    1:07 PM  6CIT Screen  What Year? 0 points  What month? 0 points  What time? 0 points  Count back from 20 0 points  Months in reverse 0 points  Repeat phrase 0 points  Total Score 0 points   Most recent Audit-C alcohol use screening    10/28/2021    1:36 PM  Alcohol Use Disorder Test (AUDIT)  1. How often do you have a drink containing alcohol? 2  2. How many drinks containing alcohol  do you have on a typical day when you are drinking? 1  3. How often do you have six or more drinks on one occasion? 2  AUDIT-C Score 5  4. How often during the last year have you found that you were not able to stop drinking once you had started? 0  5. How often during the last year have you failed to do what was normally expected from you because of drinking? 0  6. How often during the last year have you needed a first drink in the morning to get yourself going after a heavy drinking session? 0  7. How often during the last year have you had a feeling of guilt of remorse after drinking? 0  8. How often during the last year have you been unable to remember what happened the night before because you had been drinking? 0  9. Have you or someone else been injured as a result of your drinking? 0  10. Has a relative or friend or a doctor or  another health worker been concerned about your drinking or suggested you cut down? 0  Alcohol Use Disorder Identification Test Final Score (AUDIT) 5   A score of 3 or more in women, and 4 or more in men indicates increased risk for alcohol abuse, EXCEPT if all of the points are from question 1   Vision/Hearing Screen: No results found.    No results found for any visits on 10/28/21.    Assessment & Plan   Annual wellness visit done today including the all of the following: Reviewed patient's Family Medical History Reviewed and updated list of patient's medical providers Assessment of cognitive impairment was done Assessed patient's functional ability Established a written schedule for health screening [place] Completed and Reviewed  Exercise Activities and Dietary recommendations  Goals   None     Immunization History  Administered Date(s) Administered   Influenza,inj,Quad PF,6+ Mos 02/22/2014, 02/04/2016, 12/11/2016, 01/12/2018, 04/13/2019, 04/05/2020, 03/18/2021   Moderna Sars-Covid-2 Vaccination 05/23/2019, 06/20/2019, 01/27/2020   Tdap 06/22/2013, 11/06/2018   Zoster Recombinat (Shingrix) 03/18/2021, 09/16/2021    Health Maintenance  Topic Date Due   COVID-19 Vaccine (4 - Moderna series) 11/13/2021 (Originally 03/23/2020)   INFLUENZA VACCINE  06/08/2022 (Originally 10/08/2021)   COLONOSCOPY (Pts 45-45yrs Insurance coverage will need to be confirmed)  08/28/2027   TETANUS/TDAP  11/05/2028   Hepatitis C Screening  Completed   HIV Screening  Completed   Zoster Vaccines- Shingrix  Completed   HPV VACCINES  Aged Out     Discussed health benefits of physical activity, and encouraged him to engage in regular exercise appropriate for his age and condition.    Problem List Items Addressed This Visit       Active Problems   Bilateral foot-drop   Relevant Orders   CBC with Differential/Platelet   CMP14+EGFR   Essential hypertension   Relevant  Orders   CBC with Differential/Platelet   CMP14+EGFR   Other Visit Diagnoses     Benign prostatic hyperplasia without lower urinary tract symptoms    -  Primary   Relevant Orders   CBC with Differential/Platelet   CMP14+EGFR   PSA, total and free   Mixed hyperlipidemia       Relevant Orders   CBC with Differential/Platelet   CMP14+EGFR   Lipid panel   Vitamin D deficiency       Relevant Orders   VITAMIN D 25 Hydroxy (Vit-D Deficiency, Fractures)  No follow-ups on file.     [name], MD

## 2021-10-28 NOTE — Patient Instructions (Signed)
Calorie Counting for Weight Loss Calories are units of energy. Your body needs a certain number of calories from food to keep going throughout the day. When you eat or drink more calories than your body needs, your body stores the extra calories mostly as fat. When you eat or drink fewer calories than your body needs, your body burns fat to get the energy it needs. Calorie counting means keeping track of how many calories you eat and drink each day. Calorie counting can be helpful if you need to lose weight. If you eat fewer calories than your body needs, you should lose weight. Ask your health care provider what a healthy weight is for you. For calorie counting to work, you will need to eat the right number of calories each day to lose a healthy amount of weight per week. A dietitian can help you figure out how many calories you need in a day and will suggest ways to reach your calorie goal. A healthy amount of weight to lose each week is usually 1-2 lb (0.5-0.9 kg). This usually means that your daily calorie intake should be reduced by 500-750 calories. Eating 1,200-1,500 calories a day can help most women lose weight. Eating 1,500-1,800 calories a day can help most men lose weight. What do I need to know about calorie counting? Work with your health care provider or dietitian to determine how many calories you should get each day. To meet your daily calorie goal, you will need to: Find out how many calories are in each food that you would like to eat. Try to do this before you eat. Decide how much of the food you plan to eat. Keep a food log. Do this by writing down what you ate and how many calories it had. To successfully lose weight, it is important to balance calorie counting with a healthy lifestyle that includes regular activity. Where do I find calorie information?  The number of calories in a food can be found on a Nutrition Facts label. If a food does not have a Nutrition Facts label, try  to look up the calories online or ask your dietitian for help. Remember that calories are listed per serving. If you choose to have more than one serving of a food, you will have to multiply the calories per serving by the number of servings you plan to eat. For example, the label on a package of bread might say that a serving size is 1 slice and that there are 90 calories in a serving. If you eat 1 slice, you will have eaten 90 calories. If you eat 2 slices, you will have eaten 180 calories. How do I keep a food log? After each time that you eat, record the following in your food log as soon as possible: What you ate. Be sure to include toppings, sauces, and other extras on the food. How much you ate. This can be measured in cups, ounces, or number of items. How many calories were in each food and drink. The total number of calories in the food you ate. Keep your food log near you, such as in a pocket-sized notebook or on an app or website on your mobile phone. Some programs will calculate calories for you and show you how many calories you have left to meet your daily goal. What are some portion-control tips? Know how many calories are in a serving. This will help you know how many servings you can have of a certain   food. Use a measuring cup to measure serving sizes. You could also try weighing out portions on a kitchen scale. With time, you will be able to estimate serving sizes for some foods. Take time to put servings of different foods on your favorite plates or in your favorite bowls and cups so you know what a serving looks like. Try not to eat straight from a food's packaging, such as from a bag or box. Eating straight from the package makes it hard to see how much you are eating and can lead to overeating. Put the amount you would like to eat in a cup or on a plate to make sure you are eating the right portion. Use smaller plates, glasses, and bowls for smaller portions and to prevent  overeating. Try not to multitask. For example, avoid watching TV or using your computer while eating. If it is time to eat, sit down at a table and enjoy your food. This will help you recognize when you are full. It will also help you be more mindful of what and how much you are eating. What are tips for following this plan? Reading food labels Check the calorie count compared with the serving size. The serving size may be smaller than what you are used to eating. Check the source of the calories. Try to choose foods that are high in protein, fiber, and vitamins, and low in saturated fat, trans fat, and sodium. Shopping Read nutrition labels while you shop. This will help you make healthy decisions about which foods to buy. Pay attention to nutrition labels for low-fat or fat-free foods. These foods sometimes have the same number of calories or more calories than the full-fat versions. They also often have added sugar, starch, or salt to make up for flavor that was removed with the fat. Make a grocery list of lower-calorie foods and stick to it. Cooking Try to cook your favorite foods in a healthier way. For example, try baking instead of frying. Use low-fat dairy products. Meal planning Use more fruits and vegetables. One-half of your plate should be fruits and vegetables. Include lean proteins, such as chicken, turkey, and fish. Lifestyle Each week, aim to do one of the following: 150 minutes of moderate exercise, such as walking. 75 minutes of vigorous exercise, such as running. General information Know how many calories are in the foods you eat most often. This will help you calculate calorie counts faster. Find a way of tracking calories that works for you. Get creative. Try different apps or programs if writing down calories does not work for you. What foods should I eat?  Eat nutritious foods. It is better to have a nutritious, high-calorie food, such as an avocado, than a food with  few nutrients, such as a bag of potato chips. Use your calories on foods and drinks that will fill you up and will not leave you hungry soon after eating. Examples of foods that fill you up are nuts and nut butters, vegetables, lean proteins, and high-fiber foods such as whole grains. High-fiber foods are foods with more than 5 g of fiber per serving. Pay attention to calories in drinks. Low-calorie drinks include water and unsweetened drinks. The items listed above may not be a complete list of foods and beverages you can eat. Contact a dietitian for more information. What foods should I limit? Limit foods or drinks that are not good sources of vitamins, minerals, or protein or that are high in unhealthy fats. These   include: Candy. Other sweets. Sodas, specialty coffee drinks, alcohol, and juice. The items listed above may not be a complete list of foods and beverages you should avoid. Contact a dietitian for more information. How do I count calories when eating out? Pay attention to portions. Often, portions are much larger when eating out. Try these tips to keep portions smaller: Consider sharing a meal instead of getting your own. If you get your own meal, eat only half of it. Before you start eating, ask for a container and put half of your meal into it. When available, consider ordering smaller portions from the menu instead of full portions. Pay attention to your food and drink choices. Knowing the way food is cooked and what is included with the meal can help you eat fewer calories. If calories are listed on the menu, choose the lower-calorie options. Choose dishes that include vegetables, fruits, whole grains, low-fat dairy products, and lean proteins. Choose items that are boiled, broiled, grilled, or steamed. Avoid items that are buttered, battered, fried, or served with cream sauce. Items labeled as crispy are usually fried, unless stated otherwise. Choose water, low-fat milk,  unsweetened iced tea, or other drinks without added sugar. If you want an alcoholic beverage, choose a lower-calorie option, such as a glass of wine or light beer. Ask for dressings, sauces, and syrups on the side. These are usually high in calories, so you should limit the amount you eat. If you want a salad, choose a garden salad and ask for grilled meats. Avoid extra toppings such as bacon, cheese, or fried items. Ask for the dressing on the side, or ask for olive oil and vinegar or lemon to use as dressing. Estimate how many servings of a food you are given. Knowing serving sizes will help you be aware of how much food you are eating at restaurants. Where to find more information Centers for Disease Control and Prevention: www.cdc.gov [place] Department of Agriculture: myplate.gov Summary Calorie counting means keeping track of how many calories you eat and drink each day. If you eat fewer calories than your body needs, you should lose weight. A healthy amount of weight to lose per week is usually 1-2 lb (0.5-0.9 kg). This usually means reducing your daily calorie intake by 500-750 calories. The number of calories in a food can be found on a Nutrition Facts label. If a food does not have a Nutrition Facts label, try to look up the calories online or ask your dietitian for help. Use smaller plates, glasses, and bowls for smaller portions and to prevent overeating. Use your calories on foods and drinks that will fill you up and not leave you hungry shortly after a meal. This information is not intended to replace advice given to you by your health care provider. Make sure you discuss any questions you have with your health care provider. Document Revised: 04/07/2019 Document Reviewed: 04/07/2019 Elsevier Patient Education  2023 [employer]  

## 2021-10-29 LAB — CBC WITH DIFFERENTIAL/PLATELET
Basophils Absolute: 0 10*3/uL (ref 0.0–0.2)
Basos: 1 %
EOS (ABSOLUTE): 0.2 10*3/uL (ref 0.0–0.4)
Eos: 3 %
Hematocrit: 39.7 % (ref 37.5–51.0)
Hemoglobin: 13.6 g/dL (ref 13.0–17.7)
Immature Grans (Abs): 0 10*3/uL (ref 0.0–0.1)
Immature Granulocytes: 0 %
Lymphocytes Absolute: 1.6 10*3/uL (ref 0.7–3.1)
Lymphs: 27 %
MCH: 30.4 pg (ref 26.6–33.0)
MCHC: 34.3 g/dL (ref 31.5–35.7)
MCV: 89 fL (ref 79–97)
Monocytes Absolute: 0.6 10*3/uL (ref 0.1–0.9)
Monocytes: 9 %
Neutrophils Absolute: 3.6 10*3/uL (ref 1.4–7.0)
Neutrophils: 60 %
Platelets: 271 10*3/uL (ref 150–450)
RBC: 4.47 x10E6/uL (ref 4.14–5.80)
RDW: 11.9 % (ref 11.6–15.4)
WBC: 6 10*3/uL (ref 3.4–10.8)

## 2021-10-29 LAB — CMP14+EGFR
ALT: 27 IU/L (ref 0–44)
AST: 27 IU/L (ref 0–40)
Albumin/Globulin Ratio: 1.7 (ref 1.2–2.2)
Albumin: 4.7 g/dL (ref 3.8–4.9)
Alkaline Phosphatase: 129 IU/L — ABNORMAL HIGH (ref 44–121)
BUN/Creatinine Ratio: 17 (ref 9–20)
BUN: 15 mg/dL (ref 6–24)
Bilirubin Total: 0.6 mg/dL (ref 0.0–1.2)
CO2: 27 mmol/L (ref 20–29)
Calcium: 10 mg/dL (ref 8.7–10.2)
Chloride: 97 mmol/L (ref 96–106)
Creatinine, Ser: 0.88 mg/dL (ref 0.76–1.27)
Globulin, Total: 2.8 g/dL (ref 1.5–4.5)
Glucose: 106 mg/dL — ABNORMAL HIGH (ref 70–99)
Potassium: 3.6 mmol/L (ref 3.5–5.2)
Sodium: 141 mmol/L (ref 134–144)
Total Protein: 7.5 g/dL (ref 6.0–8.5)
eGFR: 102 mL/min/{1.73_m2} (ref >59)

## 2021-10-29 LAB — PSA, TOTAL AND FREE
PSA, Free Pct: 16.7 %
PSA, Free: 0.1 ng/mL
Prostate Specific Ag, Serum: 0.6 ng/mL (ref 0.0–4.0)

## 2021-10-29 LAB — LIPID PANEL
Chol/HDL Ratio: 2.5 ratio (ref 0.0–5.0)
Cholesterol, Total: 121 mg/dL (ref 100–199)
HDL: 48 mg/dL (ref >39)
LDL Chol Calc (NIH): 49 mg/dL (ref 0–99)
Triglycerides: 142 mg/dL (ref 0–149)
VLDL Cholesterol Cal: 24 mg/dL (ref 5–40)

## 2021-10-29 LAB — VITAMIN D 25 HYDROXY (VIT D DEFICIENCY, FRACTURES): Vit D, 25-Hydroxy: 46.8 ng/mL (ref 30.0–100.0)

## 2021-10-29 NOTE — Progress Notes (Signed)
Hello [name],  Your lab result is normal and/or stable.Some minor variations that are not significant are commonly marked abnormal, but do not represent any medical problem for you.  Best regards, Ebin Palazzi, M.D.

## 2021-11-22 DIAGNOSIS — M79662 Pain in left lower leg: Secondary | ICD-10-CM | POA: Diagnosis not present

## 2022-01-16 DIAGNOSIS — M109 Gout, unspecified: Secondary | ICD-10-CM | POA: Diagnosis not present

## 2022-01-16 DIAGNOSIS — Z8249 Family history of ischemic heart disease and other diseases of the circulatory system: Secondary | ICD-10-CM | POA: Diagnosis not present

## 2022-01-16 DIAGNOSIS — Z6841 Body Mass Index (BMI) 40.0 and over, adult: Secondary | ICD-10-CM | POA: Diagnosis not present

## 2022-01-16 DIAGNOSIS — I10 Essential (primary) hypertension: Secondary | ICD-10-CM | POA: Diagnosis not present

## 2022-01-16 DIAGNOSIS — E785 Hyperlipidemia, unspecified: Secondary | ICD-10-CM | POA: Diagnosis not present

## 2022-01-16 DIAGNOSIS — N529 Male erectile dysfunction, unspecified: Secondary | ICD-10-CM | POA: Diagnosis not present

## 2022-02-25 ENCOUNTER — Other Ambulatory Visit: Payer: Self-pay | Admitting: Family Medicine

## 2022-02-25 DIAGNOSIS — I10 Essential (primary) hypertension: Secondary | ICD-10-CM

## 2022-03-13 ENCOUNTER — Other Ambulatory Visit: Payer: Self-pay | Admitting: Family Medicine

## 2022-03-13 DIAGNOSIS — M1000 Idiopathic gout, unspecified site: Secondary | ICD-10-CM

## 2022-03-13 DIAGNOSIS — I10 Essential (primary) hypertension: Secondary | ICD-10-CM

## 2022-04-30 ENCOUNTER — Encounter: Payer: Self-pay | Admitting: Family Medicine

## 2022-04-30 ENCOUNTER — Ambulatory Visit: Payer: Commercial Managed Care - PPO | Admitting: Family Medicine

## 2022-04-30 VITALS — BP 133/80 | HR 84 | Temp 98.3°F | Ht 70.0 in | Wt 293.0 lb

## 2022-04-30 DIAGNOSIS — I10 Essential (primary) hypertension: Secondary | ICD-10-CM | POA: Diagnosis not present

## 2022-04-30 DIAGNOSIS — M1000 Idiopathic gout, unspecified site: Secondary | ICD-10-CM | POA: Diagnosis not present

## 2022-04-30 DIAGNOSIS — E785 Hyperlipidemia, unspecified: Secondary | ICD-10-CM | POA: Diagnosis not present

## 2022-04-30 DIAGNOSIS — M5416 Radiculopathy, lumbar region: Secondary | ICD-10-CM

## 2022-04-30 MED ORDER — PREGABALIN 50 MG PO CAPS: ORAL_CAPSULE | ORAL | 0 refills | Status: DC

## 2022-04-30 MED ORDER — CELECOXIB 400 MG PO CAPS: 400.0000 mg | ORAL_CAPSULE | Freq: Every day | ORAL | 3 refills | Status: DC

## 2022-04-30 MED ORDER — AMLODIPINE BESYLATE 10 MG PO TABS: ORAL_TABLET | ORAL | 3 refills | Status: DC

## 2022-04-30 MED ORDER — ATORVASTATIN CALCIUM 20 MG PO TABS: 20.0000 mg | ORAL_TABLET | Freq: Every day | ORAL | 2 refills | Status: DC

## 2022-04-30 MED ORDER — ALLOPURINOL 100 MG PO TABS: 200.0000 mg | ORAL_TABLET | Freq: Every day | ORAL | 2 refills | Status: DC

## 2022-04-30 MED ORDER — CARVEDILOL 12.5 MG PO TABS: 12.5000 mg | ORAL_TABLET | Freq: Two times a day (BID) | ORAL | 3 refills | Status: DC

## 2022-04-30 MED ORDER — HYDROCHLOROTHIAZIDE 25 MG PO TABS: 25.0000 mg | ORAL_TABLET | Freq: Every day | ORAL | 2 refills | Status: DC

## 2022-04-30 NOTE — Progress Notes (Deleted)
Subjective:  Patient ID: Frank Herrera, male    DOB: Nov 15, 1965  Age: 57 y.o. MRN: [id number]  CC: Medical Management of Chronic Issues   HPI Frank Herrera presents for  follow-up of hypertension. Patient has no history of headache chest pain or shortness of breath or recent cough. Patient also denies symptoms of TIA such as focal numbness or weakness. Patient denies side effects from medication. States taking it regularly.  Left lower back pain radiating to tthe left thigh. Thigh gets numb laterally. On disability. Had surgery in 2010. Now dragging the left leg.    History Frank Herrera has a past medical history of Hypertension and Lead exposure (03/19/2015).   He has a past surgical history that includes Spine surgery (2010) and Hernia repair (1990).   His family history includes Heart disease in his father; Hypertension in his brother, father, mother, and sister; Kidney disease in his father.He reports that he has never smoked. He has never used smokeless tobacco. He reports current alcohol use. He reports that he does not use drugs.  Current Outpatient Medications on File Prior to Visit  Medication Sig Dispense Refill   sildenafil (REVATIO) 20 MG tablet TAKE 2-5 PILLS AT ONCE, ORALLY, WITH EACH SEXUAL ENCOUNTER 50 tablet 5   Vitamin D, Ergocalciferol, (DRISDOL) 1.25 MG (50000 UNIT) CAPS capsule Take 1 capsule (50,000 Units total) by mouth every 7 (seven) days. 13 capsule 3   No current facility-administered medications on file prior to visit.    ROS Review of Systems  Constitutional:  Negative for fever.  Respiratory:  Negative for shortness of breath.   Cardiovascular:  Negative for chest pain.  Musculoskeletal:  Positive for back pain.  Skin:  Negative for rash.    Objective:  Physical Exam Vitals reviewed.  Constitutional:      Appearance: He is well-developed.  HENT:     Head: Normocephalic and atraumatic.     Right Ear: External ear normal.     Left Ear: External ear  normal.     Mouth/Throat:     Pharynx: No oropharyngeal exudate or posterior oropharyngeal erythema.  Eyes:     Pupils: Pupils are equal, round, and reactive to light.  Cardiovascular:     Rate and Rhythm: Normal rate and regular rhythm.     Heart sounds: No murmur heard. Pulmonary:     Effort: No respiratory distress.     Breath sounds: Normal breath sounds.  Musculoskeletal:        General: Tenderness: midline lower back.     Cervical back: Normal range of motion and neck supple.  Neurological:     Mental Status: He is alert and oriented to person, place, and time.        Assessment & Plan:   Frank Herrera was seen today for medical management of chronic issues.  Diagnoses and all orders for this visit:  Essential hypertension -     CBC with Differential/Platelet -     CMP14+EGFR -     amLODipine (NORVASC) 10 MG tablet; TAKE 1 TABLET (10 MG TOTAL) BY MOUTH DAILY.FOR BLOOD PRESSURE -     carvedilol (COREG) 12.5 MG tablet; Take 1 tablet (12.5 mg total) by mouth 2 (two) times daily. -     hydrochlorothiazide (HYDRODIURIL) 25 MG tablet; Take 1 tablet (25 mg total) by mouth daily.  Hyperlipidemia, unspecified hyperlipidemia type -     Lipid panel -     atorvastatin (LIPITOR) 20 MG tablet; Take 1 tablet (20 mg  total) by mouth daily.  Idiopathic gout, unspecified chronicity, unspecified site -     allopurinol (ZYLOPRIM) 100 MG tablet; Take 2 tablets (200 mg total) by mouth daily.  Lumbar radiculopathy, right -     celecoxib (CELEBREX) 400 MG capsule; Take 1 capsule (400 mg total) by mouth daily. With food -     pregabalin (LYRICA) 50 MG capsule; 1 qhs X7 days , then 2 qhs X 7d, then 3 qhs X 7d, then 4 qhs -     MR LUMBAR SPINE Frank Herrera; Future   Allergies as of 04/30/2022   No Known Allergies      Medication List        Accurate as of April 30, 2022 11:59 PM. If you have any questions, ask your nurse or doctor.          allopurinol 100 MG tablet Commonly known  as: ZYLOPRIM Take 2 tablets (200 mg total) by mouth daily.   amLODipine 10 MG tablet Commonly known as: NORVASC TAKE 1 TABLET (10 MG TOTAL) BY MOUTH DAILY.FOR BLOOD PRESSURE   atorvastatin 20 MG tablet Commonly known as: LIPITOR Take 1 tablet (20 mg total) by mouth daily.   carvedilol 12.5 MG tablet Commonly known as: COREG Take 1 tablet (12.5 mg total) by mouth 2 (two) times daily.   celecoxib 400 MG capsule Commonly known as: CELEBREX Take 1 capsule (400 mg total) by mouth daily. With food   hydrochlorothiazide 25 MG tablet Commonly known as: HYDRODIURIL Take 1 tablet (25 mg total) by mouth daily.   pregabalin 50 MG capsule Commonly known as: Lyrica 1 qhs X7 days , then 2 qhs X 7d, then 3 qhs X 7d, then 4 qhs Started by: [name], MD   sildenafil 20 MG tablet Commonly known as: REVATIO TAKE 2-5 PILLS AT ONCE, ORALLY, WITH EACH SEXUAL ENCOUNTER   Vitamin D (Ergocalciferol) 1.25 MG (50000 UNIT) Caps capsule Commonly known as: DRISDOL Take 1 capsule (50,000 Units total) by mouth every 7 (seven) days.        Meds ordered this encounter  Medications   allopurinol (ZYLOPRIM) 100 MG tablet    Sig: Take 2 tablets (200 mg total) by mouth daily.    Dispense:  180 tablet    Refill:  2   amLODipine (NORVASC) 10 MG tablet    Sig: TAKE 1 TABLET (10 MG TOTAL) BY MOUTH DAILY.FOR BLOOD PRESSURE    Dispense:  90 tablet    Refill:  3   atorvastatin (LIPITOR) 20 MG tablet    Sig: Take 1 tablet (20 mg total) by mouth daily.    Dispense:  90 tablet    Refill:  2   carvedilol (COREG) 12.5 MG tablet    Sig: Take 1 tablet (12.5 mg total) by mouth 2 (two) times daily.    Dispense:  180 tablet    Refill:  3   celecoxib (CELEBREX) 400 MG capsule    Sig: Take 1 capsule (400 mg total) by mouth daily. With food    Dispense:  90 capsule    Refill:  3   hydrochlorothiazide (HYDRODIURIL) 25 MG tablet    Sig: Take 1 tablet (25 mg total) by mouth daily.    Dispense:  90 tablet     Refill:  2   pregabalin (LYRICA) 50 MG capsule    Sig: 1 qhs X7 days , then 2 qhs X 7d, then 3 qhs X 7d, then 4 qhs    Dispense:  120  capsule    Refill:  0    ***  Follow-up: Return in about 1 month (around 05/29/2022).  [name], M.D.

## 2022-05-01 LAB — CMP14+EGFR
ALT: 28 IU/L (ref 0–44)
AST: 29 IU/L (ref 0–40)
Albumin/Globulin Ratio: 2.1 (ref 1.2–2.2)
Albumin: 4.6 g/dL (ref 3.8–4.9)
Alkaline Phosphatase: 125 IU/L — ABNORMAL HIGH (ref 44–121)
BUN/Creatinine Ratio: 14 (ref 9–20)
BUN: 12 mg/dL (ref 6–24)
Bilirubin Total: 0.7 mg/dL (ref 0.0–1.2)
CO2: 24 mmol/L (ref 20–29)
Calcium: 9.9 mg/dL (ref 8.7–10.2)
Chloride: 100 mmol/L (ref 96–106)
Creatinine, Ser: 0.84 mg/dL (ref 0.76–1.27)
Globulin, Total: 2.2 g/dL (ref 1.5–4.5)
Glucose: 118 mg/dL — ABNORMAL HIGH (ref 70–99)
Potassium: 3.8 mmol/L (ref 3.5–5.2)
Sodium: 141 mmol/L (ref 134–144)
Total Protein: 6.8 g/dL (ref 6.0–8.5)
eGFR: 104 mL/min/{1.73_m2} (ref >59)

## 2022-05-01 LAB — CBC WITH DIFFERENTIAL/PLATELET
Basophils Absolute: 0 10*3/uL (ref 0.0–0.2)
Basos: 1 %
EOS (ABSOLUTE): 0.2 10*3/uL (ref 0.0–0.4)
Eos: 3 %
Hematocrit: 37.5 % (ref 37.5–51.0)
Hemoglobin: 12.9 g/dL — ABNORMAL LOW (ref 13.0–17.7)
Immature Grans (Abs): 0 10*3/uL (ref 0.0–0.1)
Immature Granulocytes: 0 %
Lymphocytes Absolute: 1.5 10*3/uL (ref 0.7–3.1)
Lymphs: 31 %
MCH: 30.2 pg (ref 26.6–33.0)
MCHC: 34.4 g/dL (ref 31.5–35.7)
MCV: 88 fL (ref 79–97)
Monocytes Absolute: 0.5 10*3/uL (ref 0.1–0.9)
Monocytes: 10 %
Neutrophils Absolute: 2.7 10*3/uL (ref 1.4–7.0)
Neutrophils: 55 %
Platelets: 275 10*3/uL (ref 150–450)
RBC: 4.27 x10E6/uL (ref 4.14–5.80)
RDW: 11.8 % (ref 11.6–15.4)
WBC: 5 10*3/uL (ref 3.4–10.8)

## 2022-05-01 LAB — LIPID PANEL
Chol/HDL Ratio: 2.2 ratio (ref 0.0–5.0)
Cholesterol, Total: 112 mg/dL (ref 100–199)
HDL: 51 mg/dL (ref >39)
LDL Chol Calc (NIH): 44 mg/dL (ref 0–99)
Triglycerides: 84 mg/dL (ref 0–149)
VLDL Cholesterol Cal: 17 mg/dL (ref 5–40)

## 2022-05-02 ENCOUNTER — Encounter: Payer: Self-pay | Admitting: Family Medicine

## 2022-05-02 NOTE — Progress Notes (Signed)
Subjective:  Patient ID: Frank Herrera, male    DOB: 1965/07/04  Age: 57 y.o. MRN: [id number]   CC: Medical Management of Chronic Issues     HPI Frank Herrera presents for  follow-up of hypertension. Patient has no history of headache chest pain or shortness of breath or recent cough. Patient also denies symptoms of TIA such as focal numbness or weakness. Patient denies side effects from medication. States taking it regularly.   Left lower back pain radiating to tthe left thigh. Thigh gets numb laterally. On disability. Had surgery in 2010. Now dragging the left leg.      History Frank Herrera has a past medical history of Hypertension and Lead exposure (03/19/2015).    He has a past surgical history that includes Spine surgery (2010) and Hernia repair (1990).    His family history includes Heart disease in his father; Hypertension in his brother, father, mother, and sister; Kidney disease in his father.He reports that he has never smoked. He has never used smokeless tobacco. He reports current alcohol use. He reports that he does not use drugs.         Current Outpatient Medications on File Prior to Visit  Medication Sig Dispense Refill   sildenafil (REVATIO) 20 MG tablet TAKE 2-5 PILLS AT ONCE, ORALLY, WITH EACH SEXUAL ENCOUNTER 50 tablet 5   Vitamin D, Ergocalciferol, (DRISDOL) 1.25 MG (50000 UNIT) CAPS capsule Take 1 capsule (50,000 Units total) by mouth every 7 (seven) days. 13 capsule 3    No current facility-administered medications on file prior to visit.      ROS Review of Systems  Constitutional:  Negative for fever.  Respiratory:  Negative for shortness of breath.   Cardiovascular:  Negative for chest pain.  Musculoskeletal:  Negative for arthralgias.  Skin:  Negative for rash.      Objective:    Physical Exam Vitals reviewed.  Constitutional:      Appearance: He is well-developed.  HENT:     Head: Normocephalic and atraumatic.     Right Ear: External ear normal.      Left Ear: External ear normal.     Mouth/Throat:     Pharynx: No oropharyngeal exudate or posterior oropharyngeal erythema.  Eyes:     Pupils: Pupils are equal, round, and reactive to light.  Cardiovascular:     Rate and Rhythm: Normal rate and regular rhythm.     Heart sounds: No murmur heard. Pulmonary:     Effort: No respiratory distress.     Breath sounds: Normal breath sounds.  Musculoskeletal:     Cervical back: Normal range of motion and neck supple.  Neurological:     Mental Status: He is alert and oriented to person, place, and time.            Assessment & Plan:    Frank Herrera was seen today for medical management of chronic issues.   Diagnoses and all orders for this visit:   Essential hypertension -     CBC with Differential/Platelet -     CMP14+EGFR -     amLODipine (NORVASC) 10 MG tablet; TAKE 1 TABLET (10 MG TOTAL) BY MOUTH DAILY.FOR BLOOD PRESSURE -     hydrochlorothiazide (HYDRODIURIL) 25 MG tablet; Take 1 tablet (25 mg total) by mouth daily.   Hyperlipidemia, unspecified hyperlipidemia type -     Lipid panel   Idiopathic gout, unspecified chronicity, unspecified site -     allopurinol (ZYLOPRIM) 100 MG tablet; Take  2 tablets (200 mg total) by mouth daily.   Lumbar radiculopathy, right -     celecoxib (CELEBREX) 400 MG capsule; Take 1 capsule (400 mg total) by mouth daily. With food -     MR LUMBAR SPINE [place] CONTRAST; Future   Other orders -     atorvastatin (LIPITOR) 20 MG tablet; Take 1 tablet (20 mg total) by mouth daily. -     carvedilol (COREG) 12.5 MG tablet; Take 1 tablet (12.5 mg total) by mouth 2 (two) times daily. -     pregabalin (LYRICA) 50 MG capsule; 1 qhs X7 days , then 2 qhs X 7d, then 3 qhs X 7d, then 4 qhs     Allergies as of 04/30/2022   No Known Allergies         Medication List           Accurate as of April 30, 2022  9:59 AM. If you have any questions, ask your nurse or doctor.              allopurinol 100 MG  tablet Commonly known as: ZYLOPRIM Take 2 tablets (200 mg total) by mouth daily.    amLODipine 10 MG tablet Commonly known as: NORVASC TAKE 1 TABLET (10 MG TOTAL) BY MOUTH DAILY.FOR BLOOD PRESSURE    atorvastatin 20 MG tablet Commonly known as: LIPITOR Take 1 tablet (20 mg total) by mouth daily.    carvedilol 12.5 MG tablet Commonly known as: COREG Take 1 tablet (12.5 mg total) by mouth 2 (two) times daily.    celecoxib 400 MG capsule Commonly known as: CELEBREX Take 1 capsule (400 mg total) by mouth daily. With food    hydrochlorothiazide 25 MG tablet Commonly known as: HYDRODIURIL Take 1 tablet (25 mg total) by mouth daily.    pregabalin 50 MG capsule Commonly known as: Lyrica 1 qhs X7 days , then 2 qhs X 7d, then 3 qhs X 7d, then 4 qhs Started by: [name], MD    sildenafil 20 MG tablet Commonly known as: REVATIO TAKE 2-5 PILLS AT ONCE, ORALLY, WITH EACH SEXUAL ENCOUNTER    Vitamin D (Ergocalciferol) 1.25 MG (50000 UNIT) Caps capsule Commonly known as: DRISDOL Take 1 capsule (50,000 Units total) by mouth every 7 (seven) days.                 Meds ordered this encounter  Medications   allopurinol (ZYLOPRIM) 100 MG tablet      Sig: Take 2 tablets (200 mg total) by mouth daily.      Dispense:  180 tablet      Refill:  2   amLODipine (NORVASC) 10 MG tablet      Sig: TAKE 1 TABLET (10 MG TOTAL) BY MOUTH DAILY.FOR BLOOD PRESSURE      Dispense:  90 tablet      Refill:  3   atorvastatin (LIPITOR) 20 MG tablet      Sig: Take 1 tablet (20 mg total) by mouth daily.      Dispense:  90 tablet      Refill:  2   carvedilol (COREG) 12.5 MG tablet      Sig: Take 1 tablet (12.5 mg total) by mouth 2 (two) times daily.      Dispense:  180 tablet      Refill:  3   celecoxib (CELEBREX) 400 MG capsule      Sig: Take 1 capsule (400 mg total) by mouth daily. With food  Dispense:  90 capsule      Refill:  3   hydrochlorothiazide (HYDRODIURIL) 25 MG tablet      Sig:  Take 1 tablet (25 mg total) by mouth daily.      Dispense:  90 tablet      Refill:  2   pregabalin (LYRICA) 50 MG capsule      Sig: 1 qhs X7 days , then 2 qhs X 7d, then 3 qhs X 7d, then 4 qhs      Dispense:  120 capsule      Refill:  0         Follow-up: Return in about 1 month (around 05/29/2022).   [name], M.D.        My Note 04/30/2022   Edit   Delete   Copy      Subjective:  Patient ID: Frank Herrera, male    DOB: 1966/03/01  Age: 57 y.o. MRN: [id number]   CC: Medical Management of Chronic Issues     HPI Frank Herrera presents for  follow-up of hypertension. Patient has no history of headache chest pain or shortness of breath or recent cough. Patient also denies symptoms of TIA such as focal numbness or weakness. Patient denies side effects from medication. States taking it regularly.   Left lower back pain radiating to tthe left thigh. Thigh gets numb laterally. On disability. Had surgery in 2010. Now dragging the left leg.      History Frank Herrera has a past medical history of Hypertension and Lead exposure (03/19/2015).    He has a past surgical history that includes Spine surgery (2010) and Hernia repair (1990).    His family history includes Heart disease in his father; Hypertension in his brother, father, mother, and sister; Kidney disease in his father.He reports that he has never smoked. He has never used smokeless tobacco. He reports current alcohol use. He reports that he does not use drugs.         Current Outpatient Medications on File Prior to Visit  Medication Sig Dispense Refill   sildenafil (REVATIO) 20 MG tablet TAKE 2-5 PILLS AT ONCE, ORALLY, WITH EACH SEXUAL ENCOUNTER 50 tablet 5   Vitamin D, Ergocalciferol, (DRISDOL) 1.25 MG (50000 UNIT) CAPS capsule Take 1 capsule (50,000 Units total) by mouth every 7 (seven) days. 13 capsule 3    No current facility-administered medications on file prior to visit.      ROS Review of Systems   Constitutional:  Negative for fever.  Respiratory:  Negative for shortness of breath.   Cardiovascular:  Negative for chest pain.  Musculoskeletal:  Positive for back pain.  Skin:  Negative for rash.      Objective:  Physical Exam Vitals reviewed.  Constitutional:      Appearance: He is well-developed.  HENT:     Head: Normocephalic and atraumatic.     Right Ear: External ear normal.     Left Ear: External ear normal.     Mouth/Throat:     Pharynx: No oropharyngeal exudate or posterior oropharyngeal erythema.  Eyes:     Pupils: Pupils are equal, round, and reactive to light.  Cardiovascular:     Rate and Rhythm: Normal rate and regular rhythm.     Heart sounds: No murmur heard. Pulmonary:     Effort: No respiratory distress.     Breath sounds: Normal breath sounds.  Musculoskeletal:        General: Tenderness: midline lower back.  Cervical back: Normal range of motion and neck supple.  Neurological:     Mental Status: He is alert and oriented to person, place, and time.              Assessment & Plan:    Frank Herrera was seen today for medical management of chronic issues.   Diagnoses and all orders for this visit:   Essential hypertension -     CBC with Differential/Platelet -     CMP14+EGFR -     amLODipine (NORVASC) 10 MG tablet; TAKE 1 TABLET (10 MG TOTAL) BY MOUTH DAILY.FOR BLOOD PRESSURE -     carvedilol (COREG) 12.5 MG tablet; Take 1 tablet (12.5 mg total) by mouth 2 (two) times daily. -     hydrochlorothiazide (HYDRODIURIL) 25 MG tablet; Take 1 tablet (25 mg total) by mouth daily.   Hyperlipidemia, unspecified hyperlipidemia type -     Lipid panel -     atorvastatin (LIPITOR) 20 MG tablet; Take 1 tablet (20 mg total) by mouth daily.   Idiopathic gout, unspecified chronicity, unspecified site -     allopurinol (ZYLOPRIM) 100 MG tablet; Take 2 tablets (200 mg total) by mouth daily.   Lumbar radiculopathy, right -     celecoxib (CELEBREX) 400 MG capsule;  Take 1 capsule (400 mg total) by mouth daily. With food -     pregabalin (LYRICA) 50 MG capsule; 1 qhs X7 days , then 2 qhs X 7d, then 3 qhs X 7d, then 4 qhs -     MR LUMBAR SPINE [place]; Future     Allergies as of 04/30/2022   No Known Allergies         Medication List           Accurate as of April 30, 2022 11:59 PM. If you have any questions, ask your nurse or doctor.              allopurinol 100 MG tablet Commonly known as: ZYLOPRIM Take 2 tablets (200 mg total) by mouth daily.    amLODipine 10 MG tablet Commonly known as: NORVASC TAKE 1 TABLET (10 MG TOTAL) BY MOUTH DAILY.FOR BLOOD PRESSURE    atorvastatin 20 MG tablet Commonly known as: LIPITOR Take 1 tablet (20 mg total) by mouth daily.    carvedilol 12.5 MG tablet Commonly known as: COREG Take 1 tablet (12.5 mg total) by mouth 2 (two) times daily.    celecoxib 400 MG capsule Commonly known as: CELEBREX Take 1 capsule (400 mg total) by mouth daily. With food    hydrochlorothiazide 25 MG tablet Commonly known as: HYDRODIURIL Take 1 tablet (25 mg total) by mouth daily.    pregabalin 50 MG capsule Commonly known as: Lyrica 1 qhs X7 days , then 2 qhs X 7d, then 3 qhs X 7d, then 4 qhs Started by: [name], MD    sildenafil 20 MG tablet Commonly known as: REVATIO TAKE 2-5 PILLS AT ONCE, ORALLY, WITH EACH SEXUAL ENCOUNTER    Vitamin D (Ergocalciferol) 1.25 MG (50000 UNIT) Caps capsule Commonly known as: DRISDOL Take 1 capsule (50,000 Units total) by mouth every 7 (seven) days.                 Meds ordered this encounter  Medications   allopurinol (ZYLOPRIM) 100 MG tablet      Sig: Take 2 tablets (200 mg total) by mouth daily.      Dispense:  180 tablet      Refill:  2   amLODipine (NORVASC) 10 MG tablet      Sig: TAKE 1 TABLET (10 MG TOTAL) BY MOUTH DAILY.FOR BLOOD PRESSURE      Dispense:  90 tablet      Refill:  3   atorvastatin (LIPITOR) 20 MG tablet      Sig: Take 1 tablet (20 mg  total) by mouth daily.      Dispense:  90 tablet      Refill:  2   carvedilol (COREG) 12.5 MG tablet      Sig: Take 1 tablet (12.5 mg total) by mouth 2 (two) times daily.      Dispense:  180 tablet      Refill:  3   celecoxib (CELEBREX) 400 MG capsule      Sig: Take 1 capsule (400 mg total) by mouth daily. With food      Dispense:  90 capsule      Refill:  3   hydrochlorothiazide (HYDRODIURIL) 25 MG tablet      Sig: Take 1 tablet (25 mg total) by mouth daily.      Dispense:  90 tablet      Refill:  2   pregabalin (LYRICA) 50 MG capsule      Sig: 1 qhs X7 days , then 2 qhs X 7d, then 3 qhs X 7d, then 4 qhs      Dispense:  120 capsule      Refill:  0         Follow-up: Return in about 1 month (around 05/29/2022).   [name], M.D.

## 2022-05-02 NOTE — Progress Notes (Signed)
Hello Zadan,  Your lab result is normal and/or stable.Some minor variations that are not significant are commonly marked abnormal, but do not represent any medical problem for you.  Best regards, [name], M.D.

## 2022-05-11 IMAGING — CT CT ANGIO CHEST
3 of 8 series · 18 of 46 positions shown · IV contrast (OMNIPAQUE 350)
Comparison: None.

CLINICAL DATA: 55-year-old male with a history of thoracic aortic
aneurysm

EXAM:
CT ANGIOGRAPHY CHEST WITH CONTRAST
TECHNIQUE: Multidetector CT imaging of the chest was performed using the
standard protocol during bolus administration of intravenous
contrast. Multiplanar CT image reconstructions and MIPs were
obtained to evaluate the vascular anatomy.
CONTRAST:  100mL OMNIPAQUE IOHEXOL 350 MG/ML SOLN

[Series 4: aorta 3.0 bf37 2 · axial · 0.80mm/px · z∈[-302,-50]mm · 13 of 98 slices shown]
[im 7/98  lung]
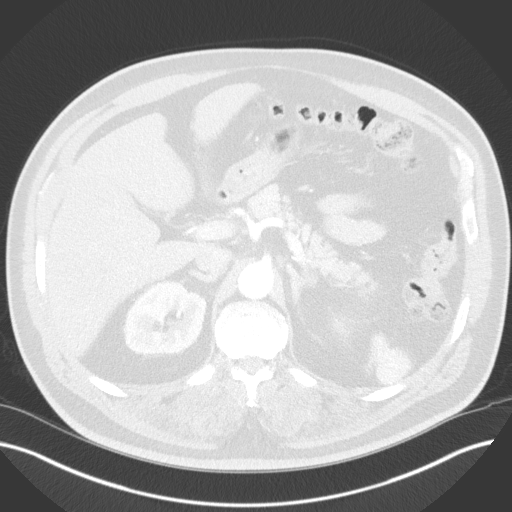
[im 14/98  soft-tissue]
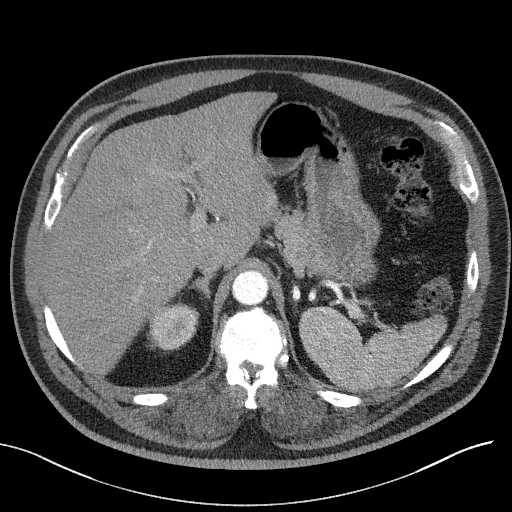
[im 21/98  lung]
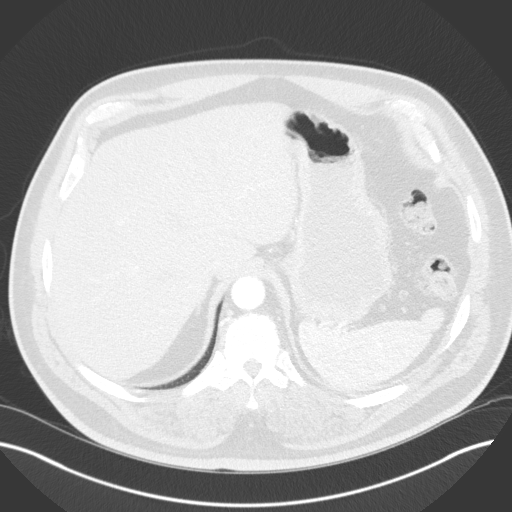
[im 28/98  soft-tissue]
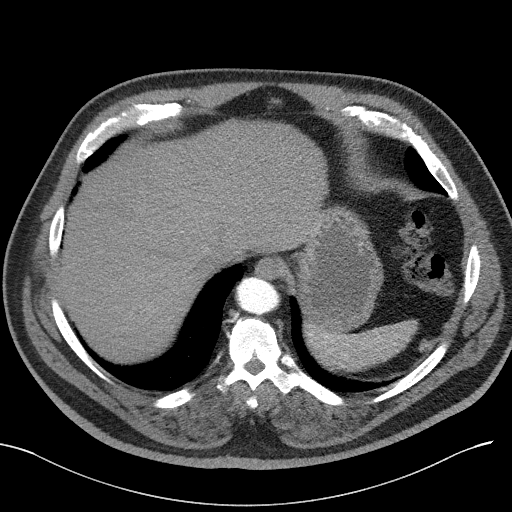
[im 35/98  lung]
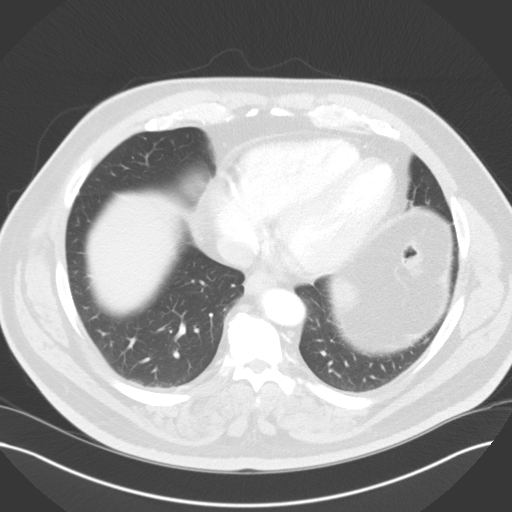
[im 42/98  soft-tissue]
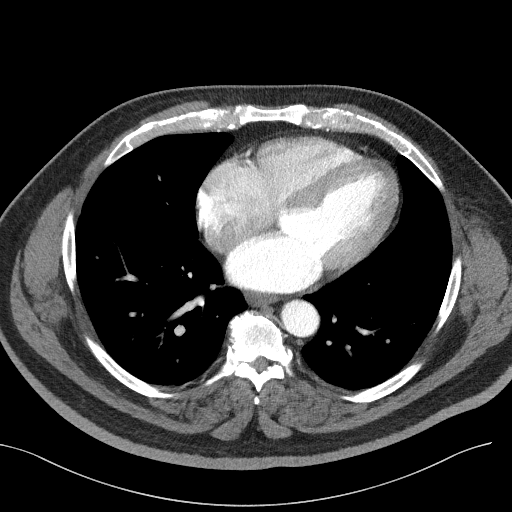
[im 49/98  lung]
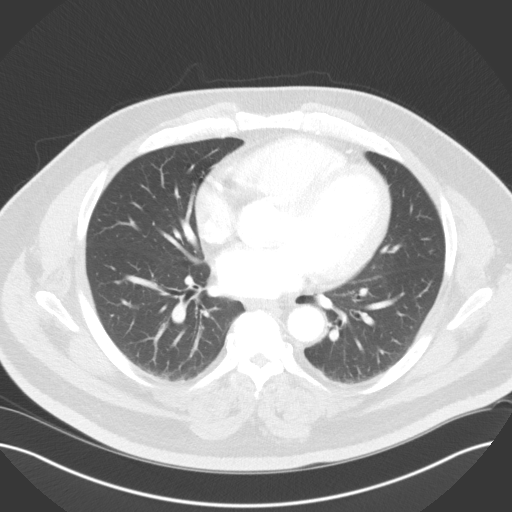
[im 56/98  soft-tissue]
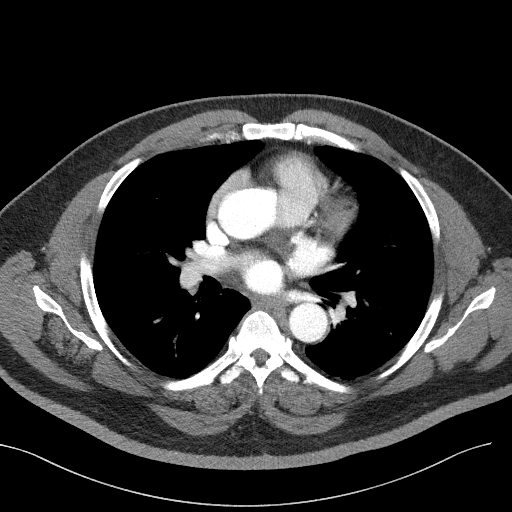
[im 63/98  lung]
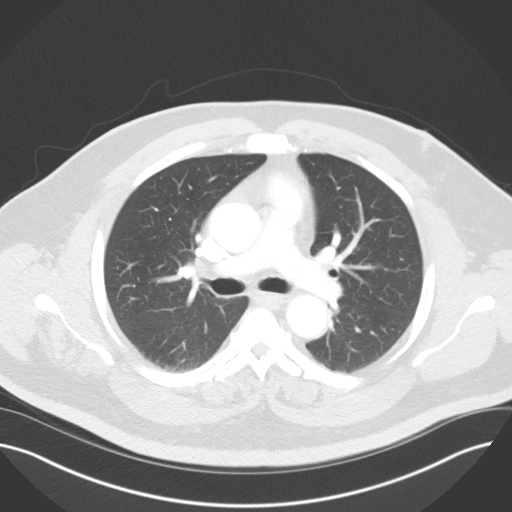
[im 70/98  soft-tissue]
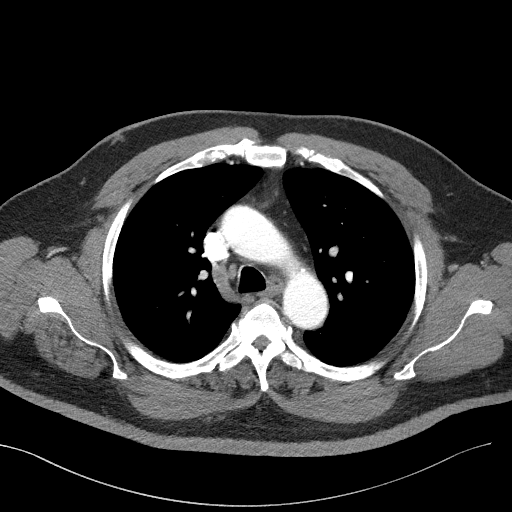
[im 77/98  lung]
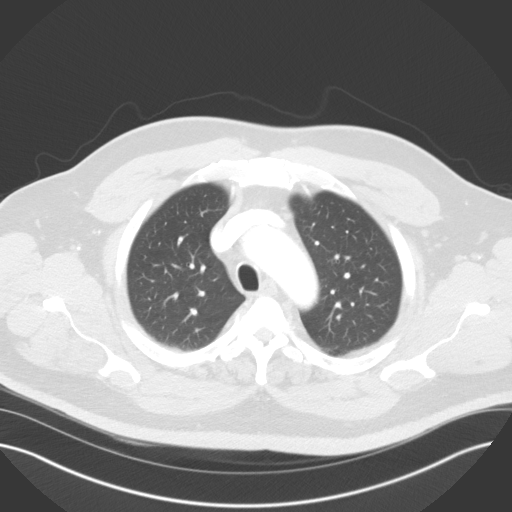
[im 84/98  soft-tissue]
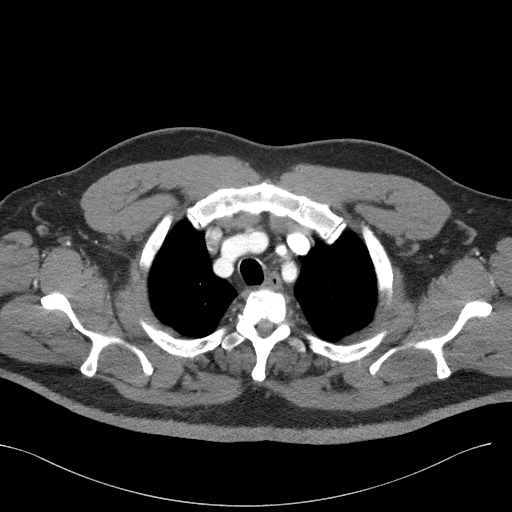
[im 91/98  lung]
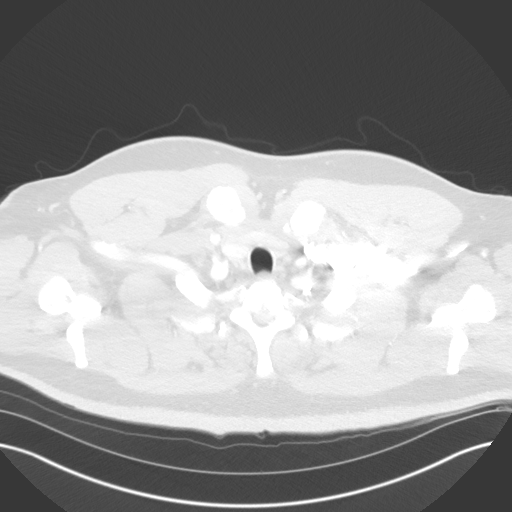

[Series 5: lung · axial · 0.80mm/px · z∈[-302,-260]mm · 2 of 98 slices shown]
[im 7/98  soft-tissue]
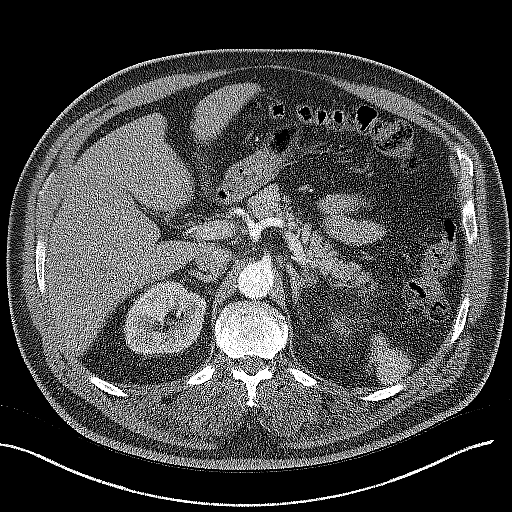
[im 21/98  soft-tissue]
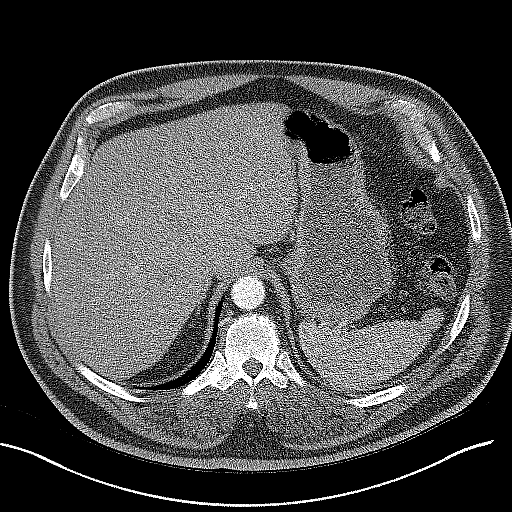

[Series 7: coronals · coronal · 0.62mm/px · 3 of 134 slices shown]
[im 34/134  soft-tissue]
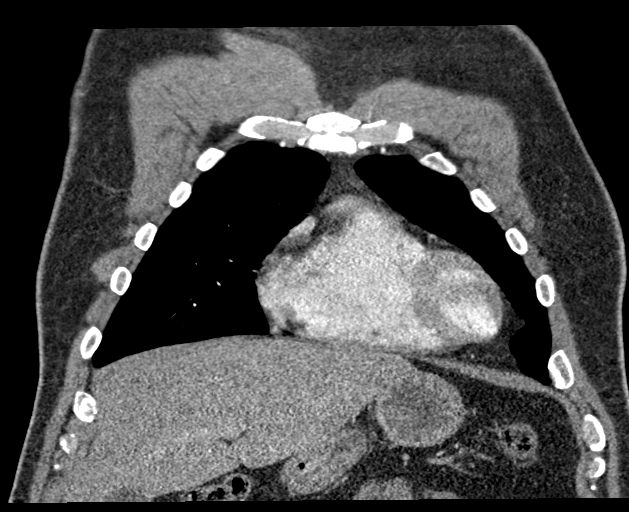
[im 67/134  soft-tissue]
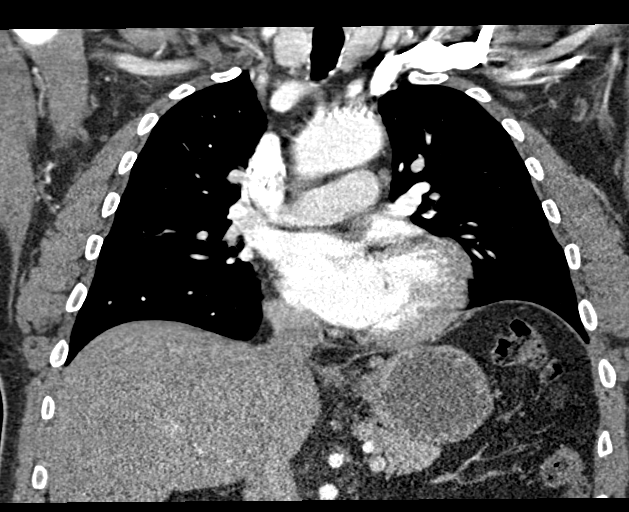
[im 100/134  soft-tissue]
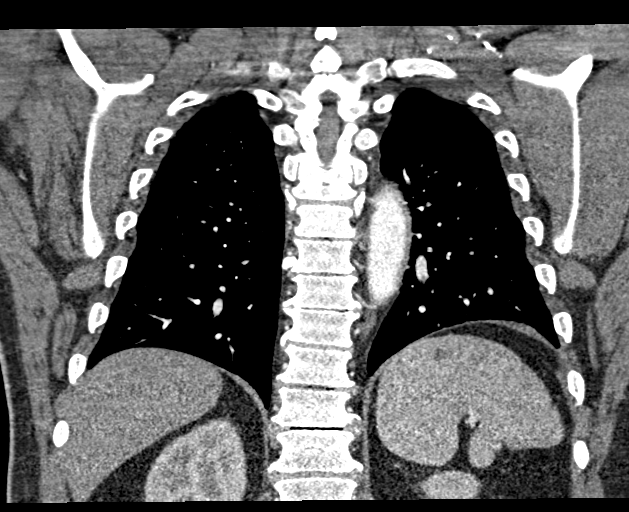

[18 of 46 positions shown; findings below may reference images not displayed]

FINDINGS: Cardiovascular:

Heart:

No cardiomegaly. No pericardial fluid/thickening. No significant
coronary calcifications.

Aorta:

No calcifications of the aortic valve. No dissection. No periaortic
fluid. 3 vessel arch with patency of the branch vessels. Cervical
cerebral vessels patent at the base of the neck. Minimal
atherosclerosis of the aortic arch.

Greatest diameter of ascending aorta measures less than 4 cm,
approximately 3.9 cm.

Pulmonary arteries:

Timing of the contrast bolus is not optimized for evaluation of
pulmonary artery filling defects.

Mediastinum/Nodes: No mediastinal adenopathy. Unremarkable
appearance of the thoracic esophagus.

Unremarkable appearance of the thoracic inlet.

Lungs/Pleura: Central airways are clear. No pleural effusion. No
confluent airspace disease.

No pneumothorax.

Upper Abdomen: No acute.

Musculoskeletal: No acute displaced fracture. Degenerative changes
of the spine.

Review of the MIP images confirms the above findings.
IMPRESSION: CT angiogram is negative for thoracic aortic aneurysm.

Minimal aortic atherosclerosis. Aortic Atherosclerosis
([id number]).

## 2022-05-26 ENCOUNTER — Other Ambulatory Visit: Payer: Self-pay | Admitting: Family Medicine

## 2022-05-26 ENCOUNTER — Telehealth: Payer: Self-pay | Admitting: Family Medicine

## 2022-05-26 MED ORDER — ALPRAZOLAM 1 MG PO TABS: ORAL_TABLET | ORAL | 0 refills | Status: AC

## 2022-05-26 NOTE — Telephone Encounter (Signed)
I sent in a scrip. He should take that one hour before the MRI, but he will need someone to drive him there and back.

## 2022-05-27 NOTE — Telephone Encounter (Signed)
Lmtcb.

## 2022-05-28 NOTE — Telephone Encounter (Signed)
Patient aware.

## 2022-05-29 ENCOUNTER — Ambulatory Visit (HOSPITAL_COMMUNITY)
Admission: RE | Admit: 2022-05-29 | Discharge: 2022-05-29 | Disposition: A | Discharge status: Home / Self Care | Payer: Commercial Managed Care - PPO | Source: Ambulatory Visit | Attending: Family Medicine | Admitting: Family Medicine

## 2022-05-29 DIAGNOSIS — M5416 Radiculopathy, lumbar region: Secondary | ICD-10-CM | POA: Diagnosis present

## 2022-05-30 ENCOUNTER — Ambulatory Visit (HOSPITAL_COMMUNITY): Payer: No Typology Code available for payment source

## 2022-06-02 ENCOUNTER — Ambulatory Visit: Payer: Commercial Managed Care - PPO | Admitting: Family Medicine

## 2022-06-02 ENCOUNTER — Encounter: Payer: Self-pay | Admitting: Family Medicine

## 2022-06-02 VITALS — BP 138/87 | HR 69 | Temp 98.0°F | Ht 70.0 in | Wt 292.4 lb

## 2022-06-02 DIAGNOSIS — M5416 Radiculopathy, lumbar region: Secondary | ICD-10-CM

## 2022-06-02 MED ORDER — PREGABALIN 225 MG PO CAPS: 225.0000 mg | ORAL_CAPSULE | Freq: Two times a day (BID) | ORAL | 0 refills | Status: DC

## 2022-06-02 NOTE — Progress Notes (Signed)
Subjective:  Patient ID: Frank Herrera, male    DOB: Sep 06, 1965  Age: 57 y.o. MRN: [id number]  CC: Medical Management of Chronic Issues  Recheck for use of Lyrica HPI [name] presents for recheck use of lyrica for pain. Sleeping better. Pain in feet diinished. Wasn't sure what to do when hhe finished week 4 so he quit taking the med. States back is now 9/10.      06/02/2022    1:19 PM 06/02/2022    1:11 PM 04/30/2022    9:27 AM  Depression screen PHQ 2/9  Decreased Interest 0 0 1  Down, Depressed, Hopeless 0 0 0  PHQ - 2 Score 0 0 1  Altered sleeping 0  0  Tired, decreased energy 1  1  Change in appetite   0  Feeling bad or failure about yourself  0  0  Trouble concentrating 0  1  Moving slowly or fidgety/restless 1  0  Suicidal thoughts 0  0  PHQ-9 Score 2  3  Difficult doing work/chores Not difficult at all  Not difficult at all    History Frank Herrera has a past medical history of Hypertension and Lead exposure (03/19/2015).   He has a past surgical history that includes Spine surgery (2010) and Hernia repair (1990).   His family history includes Heart disease in his father; Hypertension in his brother, father, mother, and sister; Kidney disease in his father.He reports that he has never smoked. He has never used smokeless tobacco. He reports current alcohol use. He reports that he does not use drugs.    ROS Review of Systems  Constitutional:  Negative for fever.  Respiratory:  Negative for shortness of breath.   Cardiovascular:  Negative for chest pain.  Musculoskeletal:  Negative for arthralgias.  Skin:  Negative for rash.    Objective:  BP 138/87   Pulse 69   Temp 98 F (36.7 C)   Ht 5\' 10"  (1.778 m)   Wt 292 lb 6.4 oz (132.6 kg)   SpO2 97%   BMI 41.96 kg/m   BP Readings from Last 3 Encounters:  06/02/22 138/87  04/30/22 133/80  10/28/21 135/84    Wt Readings from Last 3 Encounters:  06/02/22 292 lb 6.4 oz (132.6 kg)  04/30/22 293 lb (132.9 kg)   10/28/21 287 lb (130.2 kg)     Physical Exam Vitals reviewed.  Constitutional:      Appearance: He is well-developed.  HENT:     Head: Normocephalic and atraumatic.     Right Ear: External ear normal.     Left Ear: External ear normal.     Mouth/Throat:     Pharynx: No oropharyngeal exudate or posterior oropharyngeal erythema.  Eyes:     Pupils: Pupils are equal, round, and reactive to light.  Cardiovascular:     Rate and Rhythm: Normal rate and regular rhythm.     Heart sounds: No murmur heard. Pulmonary:     Effort: No respiratory distress.     Breath sounds: Normal breath sounds.  Musculoskeletal:     Cervical back: Normal range of motion and neck supple.  Neurological:     Mental Status: He is alert and oriented to person, place, and time.    MRI report noted. L! & L2 both have broad based bulging discs. There is spondylosis throughout.   Assessment & Plan:   Angus was seen today for medical management of chronic issues.  Diagnoses and all orders for this  visit:  Lumbar radiculopathy, right  Other orders -     pregabalin (LYRICA) 225 MG capsule; Take 1 capsule (225 mg total) by mouth 2 (two) times daily.       I have discontinued Aalim Stoffers. [place]'s pregabalin. I am also having him start on pregabalin. Additionally, I am having him maintain his Vitamin D (Ergocalciferol), sildenafil, allopurinol, amLODipine, atorvastatin, carvedilol, celecoxib, hydrochlorothiazide, and ALPRAZolam.  Allergies as of 06/02/2022   No Known Allergies      Medication List        Accurate as of June 02, 2022  1:45 PM. If you have any questions, ask your nurse or doctor.          allopurinol 100 MG tablet Commonly known as: ZYLOPRIM Take 2 tablets (200 mg total) by mouth daily.   ALPRAZolam 1 MG tablet Commonly known as: XANAX Take one tablet one hour before MRI   amLODipine 10 MG tablet Commonly known as: NORVASC TAKE 1 TABLET (10 MG TOTAL) BY MOUTH DAILY.FOR  BLOOD PRESSURE   atorvastatin 20 MG tablet Commonly known as: LIPITOR Take 1 tablet (20 mg total) by mouth daily.   carvedilol 12.5 MG tablet Commonly known as: COREG Take 1 tablet (12.5 mg total) by mouth 2 (two) times daily.   celecoxib 400 MG capsule Commonly known as: CELEBREX Take 1 capsule (400 mg total) by mouth daily. With food   hydrochlorothiazide 25 MG tablet Commonly known as: HYDRODIURIL Take 1 tablet (25 mg total) by mouth daily.   pregabalin 225 MG capsule Commonly known as: LYRICA Take 1 capsule (225 mg total) by mouth 2 (two) times daily. What changed:  medication strength how much to take how to take this when to take this additional instructions Changed by: [name], MD   sildenafil 20 MG tablet Commonly known as: REVATIO TAKE 2-5 PILLS AT ONCE, ORALLY, WITH EACH SEXUAL ENCOUNTER   Vitamin D (Ergocalciferol) 1.25 MG (50000 UNIT) Caps capsule Commonly known as: DRISDOL Take 1 capsule (50,000 Units total) by mouth every 7 (seven) days.         Follow-up: Return in about 2 months (around 08/02/2022).  [name], M.D.

## 2022-06-03 ENCOUNTER — Other Ambulatory Visit: Payer: Self-pay | Admitting: Family Medicine

## 2022-06-03 DIAGNOSIS — M5416 Radiculopathy, lumbar region: Secondary | ICD-10-CM

## 2022-06-08 ENCOUNTER — Other Ambulatory Visit: Payer: Self-pay | Admitting: Family Medicine

## 2022-06-08 DIAGNOSIS — E559 Vitamin D deficiency, unspecified: Secondary | ICD-10-CM

## 2022-06-18 ENCOUNTER — Ambulatory Visit: Payer: Commercial Managed Care - PPO

## 2022-09-03 ENCOUNTER — Ambulatory Visit: Payer: Commercial Managed Care - PPO | Admitting: Family Medicine

## 2022-10-15 ENCOUNTER — Other Ambulatory Visit: Payer: Self-pay

## 2022-10-15 ENCOUNTER — Ambulatory Visit: Payer: Commercial Managed Care - PPO | Attending: Neurosurgery | Admitting: Physical Therapy

## 2022-10-15 DIAGNOSIS — R2681 Unsteadiness on feet: Secondary | ICD-10-CM | POA: Diagnosis present

## 2022-10-15 DIAGNOSIS — M5459 Other low back pain: Secondary | ICD-10-CM

## 2022-10-15 DIAGNOSIS — M6281 Muscle weakness (generalized): Secondary | ICD-10-CM | POA: Diagnosis present

## 2022-10-15 NOTE — Therapy (Signed)
OUTPATIENT PHYSICAL THERAPY THORACOLUMBAR EVALUATION   Patient Name: Frank Herrera MRN: [id number] DOB:Jul 31, 1965, 57 y.o., male Today's Date: 10/15/2022  END OF SESSION:  PT End of Session - 10/15/22 0912     Visit Number 1    Number of Visits 12    Date for PT Re-Evaluation 11/26/22    Authorization Type FOTO.    PT Start Time 0813    PT Stop Time 0844    PT Time Calculation (min) 31 min    Activity Tolerance Patient tolerated treatment well    Behavior During Therapy [facility] for tasks assessed/performed             Past Medical History:  Diagnosis Date   Hypertension    Lead exposure 03/19/2015   Past Surgical History:  Procedure Laterality Date   HERNIA REPAIR  1990   SPINE SURGERY  2010   Patient Active Problem List   Diagnosis Date Noted   Family history of bicuspid aortic valve 04/17/2020   Daytime somnolence 04/17/2020   History of adenomatous polyp of colon 09/07/2017   Bilateral foot-drop 04/12/2016   Cardiac enzymes elevated 09/12/2015   Hypokalemia 09/12/2015   Lightheadedness 09/12/2015   Essential hypertension 03/19/2015   Primary gout 03/19/2015   Onychomycosis 03/19/2015   Severe obesity (BMI >= 40) (HCC) 03/19/2015    REFERRING PROVIDER: Lisbeth Renshaw MD  REFERRING DIAG: Lumbar stenosis with neurogenic claudication  Rationale for Evaluation and Treatment: Rehabilitation  THERAPY DIAG:  Other low back pain  Muscle weakness (generalized)  Unsteadiness on feet  ONSET DATE: Ongoing.  SUBJECTIVE:                                                                                                                                                                                           SUBJECTIVE STATEMENT: The patient presents to the clinic s/p sublaminar decompression at L2-3 performed on 09/05/22.  He is pleased with the outcome of his surgery and reports the symptoms he was experiencing in his LE's has improved.  He has had chronic foot drop  and wears bilateral ankle braces.  He states his wife has found a doctor in [place] that specializes in this and he may arrange an appointment.  His pain-level is a 4/10 today  but can rise to higher levels when standing for long periods of time.  Sitting decreases his pain.  He reports falls in the past but is now using a cane for safety.    PERTINENT HISTORY:  Prior lumbar surgery, bilateral foot drop.  PAIN:  Are you having pain? Yes: NPRS scale: 4/10 Pain location: LB Pain description: Ache,  tingling Aggravating factors: As above. Relieving factors: As above.  PRECAUTIONS: Fall  Please monitor gait at all times.  RED FLAGS: None   WEIGHT BEARING RESTRICTIONS: No  FALLS:  Has patient fallen in last 6 months? Yes. Number of falls 2-3.  LIVING ENVIRONMENT: Lives with: lives with their spouse Lives in: House/apartment Stairs: Yes.  He performs a non-reciprocating stair gait. Has following equipment at home: Single point cane  OCCUPATION: Disabled.  PLOF: Independent with household mobility with device  PATIENT GOALS: Wants to get stronger and walk better.   OBJECTIVE:   PATIENT SURVEYS:  FOTO .   POSTURE: rounded shoulders and forward head  PALPATION: No significant tenderness in lumbar region  LOWER EXTREMITY ROM:     WF/NL.  LOWER EXTREMITY MMT:    MMT Right eval Left eval  Hip flexion 4-/5 4-/5  Hip extension 4-/5 4-/5  Hip abduction 4-/5 4-/5  Knee flexion 4-/5 4-/5  Knee extension 4/5 4-/5(shaky)  Ankle dorsiflexion 0/5 0/5  Ankle plantarflexion 4+/5 4+/5   (Blank rows = not tested)   FUNCTIONAL TESTS:  5 times sit to stand: 21 seconds Timed up and go (TUG): 24 seconds Romberg test (-) though supervision required.  GAIT: The patient walks with an ataxic gait and a tendency keeps his hip forward of his trunk with a steppage-type gait that is cautious and purposeful using a straight cane for safety.  TODAY'S TREATMENT:                                                                                                                               DATE:    PATIENT EDUCATION:  Education details: Discussed progression into gait, balance and strengthening program. Person educated: Patient Education method: Medical illustrator Education comprehension: verbalized understanding  HOME EXERCISE PROGRAM:   ASSESSMENT:  CLINICAL IMPRESSION: The patient presents to [facility] s/p sublaminar decompression at L2-3 performed on 09/05/22. He is pleased with his surgical outcome thus far.  He has bilateral ankle braces donned due to chronic foot drop.  He is globally weak over his bilateral LE's.  His gait is ataxic in nature and he has a tendency to keep his hips forward of his trunk with a steppage-type gait that is cautious and purposeful using a straight cane for safety.  His TUG was 21 seconds and TUG was 24 seconds.  He is very motivated to improve.  Patient will benefit from skilled physical therapy intervention to address pain and deficits.  OBJECTIVE IMPAIRMENTS: Abnormal gait, decreased activity tolerance, decreased balance, decreased coordination, decreased mobility, difficulty walking, decreased strength, postural dysfunction, and pain.   ACTIVITY LIMITATIONS: carrying, lifting, bending, standing, stairs, and locomotion level  PARTICIPATION LIMITATIONS: meal prep, cleaning, medication management, and yard work  PERSONAL FACTORS: Time since onset of injury/illness/exacerbation and 1-2 comorbidities: chronic foot drop and previous lumbar surgery  are also affecting patient's functional outcome.   REHAB POTENTIAL: Good  CLINICAL DECISION MAKING: Stable/uncomplicated  EVALUATION COMPLEXITY: Moderate   GOALS:  SHORT TERM GOALS: Target date: 10/29/22.  Ind with an initial HEP. Goal status: INITIAL   LONG TERM GOALS: Target date: 11/26/22  Ind with an advanced HEP.  Goal status: INITIAL  2.  Increase bilateral hip and and  knee strength to a solid 4+/5 to increase stability for functional tasks.  Goal status: INITIAL  3.  Perform a reciprocating stair gait with one railing.  Goal status: INITIAL  4.  Improve TUG to 17 seconds.  Goal status: INITIAL  5.  Improve 5 time sit to stand test to 15 seconds.  Goal status: INITIAL  PLAN:  PT FREQUENCY: 2x/week  PT DURATION: 6 weeks  PLANNED INTERVENTIONS: Therapeutic exercises, Therapeutic activity, Neuromuscular re-education, Balance training, Gait training, Patient/Family education, Self Care, Electrical stimulation, Cryotherapy, Moist heat, Ultrasound, and Manual therapy.  PLAN FOR NEXT SESSION: Balance and gait training, core exercise progression, LE strengthening.  Neuro-re-education.   Undrea Shipes, [place], PT 10/15/2022, 10:44 AM

## 2022-10-21 ENCOUNTER — Ambulatory Visit: Payer: Commercial Managed Care - PPO

## 2022-10-21 DIAGNOSIS — M5459 Other low back pain: Secondary | ICD-10-CM | POA: Diagnosis not present

## 2022-10-21 DIAGNOSIS — M6281 Muscle weakness (generalized): Secondary | ICD-10-CM

## 2022-10-21 DIAGNOSIS — R2681 Unsteadiness on feet: Secondary | ICD-10-CM

## 2022-10-21 NOTE — Therapy (Signed)
OUTPATIENT PHYSICAL THERAPY THORACOLUMBAR TREATMENT   Patient Name: Frank Herrera MRN: [id number] DOB:08/08/1965, 57 y.o., male Today's Date: 10/21/2022  END OF SESSION:  PT End of Session - 10/21/22 0803     Visit Number 2    Number of Visits 12    Date for PT Re-Evaluation 11/26/22    Authorization Type FOTO.    PT Start Time 0800    Activity Tolerance Patient tolerated treatment well    Behavior During Therapy [facility] for tasks assessed/performed             Past Medical History:  Diagnosis Date   Hypertension    Lead exposure 03/19/2015   Past Surgical History:  Procedure Laterality Date   HERNIA REPAIR  1990   SPINE SURGERY  2010   Patient Active Problem List   Diagnosis Date Noted   Family history of bicuspid aortic valve 04/17/2020   Daytime somnolence 04/17/2020   History of adenomatous polyp of colon 09/07/2017   Bilateral foot-drop 04/12/2016   Cardiac enzymes elevated 09/12/2015   Hypokalemia 09/12/2015   Lightheadedness 09/12/2015   Essential hypertension 03/19/2015   Primary gout 03/19/2015   Onychomycosis 03/19/2015   Severe obesity (BMI >= 40) (HCC) 03/19/2015    REFERRING PROVIDER: Lisbeth Renshaw MD  REFERRING DIAG: Lumbar stenosis with neurogenic claudication  Rationale for Evaluation and Treatment: Rehabilitation  THERAPY DIAG:  Other low back pain  Muscle weakness (generalized)  Unsteadiness on feet  ONSET DATE: Ongoing.  SUBJECTIVE:                                                                                                                                                                                           SUBJECTIVE STATEMENT: Pt reports 5/10 bil feet pain today, denies any back pain today.  PERTINENT HISTORY:  Prior lumbar surgery, bilateral foot drop.  PAIN:  Are you having pain? Yes: NPRS scale: 5/10 Pain location: LB Pain description: Ache, tingling Aggravating factors: As above. Relieving factors: As  above.  PRECAUTIONS: Fall  Please monitor gait at all times.  RED FLAGS: None   WEIGHT BEARING RESTRICTIONS: No  FALLS:  Has patient fallen in last 6 months? Yes. Number of falls 2-3.  LIVING ENVIRONMENT: Lives with: lives with their spouse Lives in: House/apartment Stairs: Yes.  He performs a non-reciprocating stair gait. Has following equipment at home: Single point cane  OCCUPATION: Disabled.  PLOF: Independent with household mobility with device  PATIENT GOALS: Wants to get stronger and walk better.   OBJECTIVE:   PATIENT SURVEYS:  FOTO .   POSTURE: rounded shoulders and forward head  PALPATION: No significant  tenderness in lumbar region  LOWER EXTREMITY ROM:     WF/NL.  LOWER EXTREMITY MMT:    MMT Right eval Left eval  Hip flexion 4-/5 4-/5  Hip extension 4-/5 4-/5  Hip abduction 4-/5 4-/5  Knee flexion 4-/5 4-/5  Knee extension 4/5 4-/5(shaky)  Ankle dorsiflexion 0/5 0/5  Ankle plantarflexion 4+/5 4+/5   (Blank rows = not tested)   FUNCTIONAL TESTS:  5 times sit to stand: 21 seconds Timed up and go (TUG): 24 seconds Romberg test (-) though supervision required.  GAIT: The patient walks with an ataxic gait and a tendency keeps his hip forward of his trunk with a steppage-type gait that is cautious and purposeful using a straight cane for safety.  TODAY'S TREATMENT:                                                                                                                              DATE:                                     EXERCISE LOG  Exercise Repetitions and Resistance Comments  Nustep Level 3 x 15 mins; seat 12   LAQs 2# x 20 reps bil w hold at top   Seated Marches 2# x 20 reps bil   Seated Ham Curl Red x 20 reps bil   STS X10 reps    Seated Hip Adduction x3 mins   Seated Hip Abduction Red x 3 mins    Blank cell = exercise not performed today    PATIENT EDUCATION:  Education details: Discussed progression into gait,  balance and strengthening program. Person educated: Patient Education method: Medical illustrator Education comprehension: verbalized understanding  HOME EXERCISE PROGRAM:   ASSESSMENT:  CLINICAL IMPRESSION: Pt arrives for today's treatment session reporting 5/10 bil foot pain.  Pt able to tolerate introduction to Nustep today without issue or complaint of discomfort.  Pt introduced to seated BLE exercises today to increase strength and function.  Pt requiring min cues for proper technique and posture with all newly added exercises.  Pt denied any change in pain at completion of today's treatment session.  OBJECTIVE IMPAIRMENTS: Abnormal gait, decreased activity tolerance, decreased balance, decreased coordination, decreased mobility, difficulty walking, decreased strength, postural dysfunction, and pain.   ACTIVITY LIMITATIONS: carrying, lifting, bending, standing, stairs, and locomotion level  PARTICIPATION LIMITATIONS: meal prep, cleaning, medication management, and yard work  PERSONAL FACTORS: Time since onset of injury/illness/exacerbation and 1-2 comorbidities: chronic foot drop and previous lumbar surgery  are also affecting patient's functional outcome.   REHAB POTENTIAL: Good  CLINICAL DECISION MAKING: Stable/uncomplicated  EVALUATION COMPLEXITY: Moderate   GOALS:  SHORT TERM GOALS: Target date: 10/29/22.  Ind with an initial HEP. Goal status: INITIAL   LONG TERM GOALS: Target date: 11/26/22  Ind with an advanced HEP.  Goal status: INITIAL  2.  Increase bilateral hip and and knee strength to a solid 4+/5 to increase stability for functional tasks.  Goal status: INITIAL  3.  Perform a reciprocating stair gait with one railing.  Goal status: INITIAL  4.  Improve TUG to 17 seconds.  Goal status: INITIAL  5.  Improve 5 time sit to stand test to 15 seconds.  Goal status: INITIAL  PLAN:  PT FREQUENCY: 2x/week  PT DURATION: 6 weeks  PLANNED  INTERVENTIONS: Therapeutic exercises, Therapeutic activity, Neuromuscular re-education, Balance training, Gait training, Patient/Family education, Self Care, Electrical stimulation, Cryotherapy, Moist heat, Ultrasound, and Manual therapy.  PLAN FOR NEXT SESSION: Balance and gait training, core exercise progression, LE strengthening.  Neuro-re-education.   Newman Pies, PTA 10/21/2022, 9:50 AM

## 2022-10-23 ENCOUNTER — Ambulatory Visit: Payer: Commercial Managed Care - PPO | Admitting: Physical Therapy

## 2022-10-23 ENCOUNTER — Encounter: Payer: Self-pay | Admitting: Physical Therapy

## 2022-10-23 DIAGNOSIS — M5459 Other low back pain: Secondary | ICD-10-CM | POA: Diagnosis not present

## 2022-10-23 DIAGNOSIS — M6281 Muscle weakness (generalized): Secondary | ICD-10-CM

## 2022-10-23 DIAGNOSIS — R2681 Unsteadiness on feet: Secondary | ICD-10-CM

## 2022-10-23 NOTE — Therapy (Signed)
OUTPATIENT PHYSICAL THERAPY THORACOLUMBAR TREATMENT   Patient Name: Frank Herrera MRN: [id number] DOB:04-Aug-1965, 57 y.o., male Today's Date: 10/23/2022  END OF SESSION:  PT End of Session - 10/23/22 0804     Visit Number 3    Number of Visits 12    Date for PT Re-Evaluation 11/26/22    Authorization Type FOTO.    PT Start Time 0802    PT Stop Time 0841    PT Time Calculation (min) 39 min    Activity Tolerance Patient tolerated treatment well    Behavior During Therapy [facility] for tasks assessed/performed            Past Medical History:  Diagnosis Date   Hypertension    Lead exposure 03/19/2015   Past Surgical History:  Procedure Laterality Date   HERNIA REPAIR  1990   SPINE SURGERY  2010   Patient Active Problem List   Diagnosis Date Noted   Family history of bicuspid aortic valve 04/17/2020   Daytime somnolence 04/17/2020   History of adenomatous polyp of colon 09/07/2017   Bilateral foot-drop 04/12/2016   Cardiac enzymes elevated 09/12/2015   Hypokalemia 09/12/2015   Lightheadedness 09/12/2015   Essential hypertension 03/19/2015   Primary gout 03/19/2015   Onychomycosis 03/19/2015   Severe obesity (BMI >= 40) (HCC) 03/19/2015   REFERRING PROVIDER: Lisbeth Renshaw MD  REFERRING DIAG: Lumbar stenosis with neurogenic claudication  Rationale for Evaluation and Treatment: Rehabilitation  THERAPY DIAG:  Other low back pain  Muscle weakness (generalized)  Unsteadiness on feet  ONSET DATE: Ongoing.  SUBJECTIVE:                                                                                                                                                                                           SUBJECTIVE STATEMENT: More tenderness reported today.  PERTINENT HISTORY:  Prior lumbar surgery, bilateral foot drop.  PAIN:  Are you having pain? Yes: NPRS scale: 2-3/10 Pain location: LB Pain description: Ache, tingling Aggravating factors: As  above. Relieving factors: As above.  PRECAUTIONS: Fall  Please monitor gait at all times.  RED FLAGS: None   WEIGHT BEARING RESTRICTIONS: No  FALLS:  Has patient fallen in last 6 months? Yes. Number of falls 2-3.  PATIENT GOALS: Wants to get stronger and walk better.  OBJECTIVE:   PATIENT SURVEYS:  FOTO .  POSTURE: rounded shoulders and forward head  PALPATION: No significant tenderness in lumbar region  LOWER EXTREMITY ROM:     WF/NL.  LOWER EXTREMITY MMT:    MMT Right eval Left eval  Hip flexion 4-/5 4-/5  Hip extension 4-/5 4-/5  Hip abduction 4-/5 4-/5  Knee flexion 4-/5 4-/5  Knee extension 4/5 4-/5(shaky)  Ankle dorsiflexion 0/5 0/5  Ankle plantarflexion 4+/5 4+/5   (Blank rows = not tested)   FUNCTIONAL TESTS:  5 times sit to stand: 21 seconds Timed up and go (TUG): 24 seconds Romberg test (-) though supervision required.  GAIT: The patient walks with an ataxic gait and a tendency keeps his hip forward of his trunk with a steppage-type gait that is cautious and purposeful using a straight cane for safety.  TODAY'S TREATMENT:                                                                                                                              DATE: 10/23/22  EXERCISE LOG  Exercise Repetitions and Resistance Comments  Nustep Level 3 x 15 mins; seat 13   LAQs 2# x 30 reps bil w hold at top   Standing hip flexion X15 reps   Standing hip abduction X10 reps   Heel raises X15 reps   Seated Ham Curl Red x 20 reps bil   Seated Hip Adduction x3 mins   Seated Hip Abduction Red x 3 mins    Blank cell = exercise not performed today   PATIENT EDUCATION:  Education details: Discussed progression into gait, balance and strengthening program. Person educated: Patient Education method: Medical illustrator Education comprehension: verbalized understanding  HOME EXERCISE PROGRAM:  ASSESSMENT:  CLINICAL IMPRESSION: Patient presented in  clinic with mild LBP reported. Patient able to tolerate minimal exercises in standing for tolerance but limited due to fatigue and pain that started. Patient able to tolerate resisted seated exercises well.   OBJECTIVE IMPAIRMENTS: Abnormal gait, decreased activity tolerance, decreased balance, decreased coordination, decreased mobility, difficulty walking, decreased strength, postural dysfunction, and pain.   ACTIVITY LIMITATIONS: carrying, lifting, bending, standing, stairs, and locomotion level  PARTICIPATION LIMITATIONS: meal prep, cleaning, medication management, and yard work  PERSONAL FACTORS: Time since onset of injury/illness/exacerbation and 1-2 comorbidities: chronic foot drop and previous lumbar surgery  are also affecting patient's functional outcome.   REHAB POTENTIAL: Good  CLINICAL DECISION MAKING: Stable/uncomplicated  EVALUATION COMPLEXITY: Moderate  GOALS:  SHORT TERM GOALS: Target date: 10/29/22.  Ind with an initial HEP. Goal status: INITIAL  LONG TERM GOALS: Target date: 11/26/22  Ind with an advanced HEP.  Goal status: INITIAL  2.  Increase bilateral hip and and knee strength to a solid 4+/5 to increase stability for functional tasks.  Goal status: INITIAL  3.  Perform a reciprocating stair gait with one railing.  Goal status: INITIAL  4.  Improve TUG to 17 seconds.  Goal status: INITIAL  5.  Improve 5 time sit to stand test to 15 seconds.  Goal status: INITIAL  PLAN:  PT FREQUENCY: 2x/week  PT DURATION: 6 weeks  PLANNED INTERVENTIONS: Therapeutic exercises, Therapeutic activity, Neuromuscular re-education, Balance training, Gait training, Patient/Family education, Self Care, Electrical stimulation, Cryotherapy, Moist heat, Ultrasound,  and Manual therapy.  PLAN FOR NEXT SESSION: Balance and gait training, core exercise progression, LE strengthening.  Neuro-re-education.   Marvell Fuller, PTA 10/23/2022, 11:58 AM

## 2022-10-28 ENCOUNTER — Ambulatory Visit: Payer: Commercial Managed Care - PPO

## 2022-10-28 DIAGNOSIS — M6281 Muscle weakness (generalized): Secondary | ICD-10-CM

## 2022-10-28 DIAGNOSIS — M5459 Other low back pain: Secondary | ICD-10-CM

## 2022-10-28 DIAGNOSIS — R2681 Unsteadiness on feet: Secondary | ICD-10-CM

## 2022-10-28 NOTE — Therapy (Signed)
OUTPATIENT PHYSICAL THERAPY THORACOLUMBAR TREATMENT   Patient Name: Frank Herrera MRN: [id number] DOB:11/07/1965, 57 y.o., male Today's Date: 10/28/2022  END OF SESSION:  PT End of Session - 10/28/22 0805     Visit Number 4    Number of Visits 12    Date for PT Re-Evaluation 11/26/22    Authorization Type FOTO.    PT Start Time 0800    PT Stop Time 0845    PT Time Calculation (min) 45 min    Activity Tolerance Patient tolerated treatment well    Behavior During Therapy [facility] for tasks assessed/performed            Past Medical History:  Diagnosis Date   Hypertension    Lead exposure 03/19/2015   Past Surgical History:  Procedure Laterality Date   HERNIA REPAIR  1990   SPINE SURGERY  2010   Patient Active Problem List   Diagnosis Date Noted   Family history of bicuspid aortic valve 04/17/2020   Daytime somnolence 04/17/2020   History of adenomatous polyp of colon 09/07/2017   Bilateral foot-drop 04/12/2016   Cardiac enzymes elevated 09/12/2015   Hypokalemia 09/12/2015   Lightheadedness 09/12/2015   Essential hypertension 03/19/2015   Primary gout 03/19/2015   Onychomycosis 03/19/2015   Severe obesity (BMI >= 40) (HCC) 03/19/2015   REFERRING PROVIDER: Lisbeth Renshaw MD  REFERRING DIAG: Lumbar stenosis with neurogenic claudication  Rationale for Evaluation and Treatment: Rehabilitation  THERAPY DIAG:  Other low back pain  Muscle weakness (generalized)  Unsteadiness on feet  ONSET DATE: Ongoing.  SUBJECTIVE:                                                                                                                                                                                           SUBJECTIVE STATEMENT: Pt denies any pain today, but does report increased weakness.   PERTINENT HISTORY:  Prior lumbar surgery, bilateral foot drop.  PAIN:  Are you having pain? No  PRECAUTIONS: Fall  Please monitor gait at all times.  RED  FLAGS: None   WEIGHT BEARING RESTRICTIONS: No  FALLS:  Has patient fallen in last 6 months? Yes. Number of falls 2-3.  PATIENT GOALS: Wants to get stronger and walk better.  OBJECTIVE:   PATIENT SURVEYS:  FOTO .  POSTURE: rounded shoulders and forward head  PALPATION: No significant tenderness in lumbar region  LOWER EXTREMITY ROM:     WF/NL.  LOWER EXTREMITY MMT:    MMT Right eval Left eval  Hip flexion 4-/5 4-/5  Hip extension 4-/5 4-/5  Hip abduction 4-/5 4-/5  Knee flexion 4-/5 4-/5  Knee  extension 4/5 4-/5(shaky)  Ankle dorsiflexion 0/5 0/5  Ankle plantarflexion 4+/5 4+/5   (Blank rows = not tested)   FUNCTIONAL TESTS:  5 times sit to stand: 21 seconds Timed up and go (TUG): 24 seconds Romberg test (-) though supervision required.  GAIT: The patient walks with an ataxic gait and a tendency keeps his hip forward of his trunk with a steppage-type gait that is cautious and purposeful using a straight cane for safety.  TODAY'S TREATMENT:                                                                                                                              DATE: 10/28/22  EXERCISE LOG  Exercise Repetitions and Resistance Comments  Nustep Level 3 x 15 mins; seat 11   LAQs 3# x 20 reps bil w hold at top   Seated hip flexion 3# x 20 reps bil   Standing hip abduction    Heel raises X20 reps   Seated Ham Curl Red x 25 reps bil   Seated Hip Adduction x3 mins   Seated Hip Abduction Red x 3 mins   Seated Toe Tap 6" x 2 mins    Blank cell = exercise not performed today   PATIENT EDUCATION:  Education details: Discussed progression into gait, balance and strengthening program. Person educated: Patient Education method: Medical illustrator Education comprehension: verbalized understanding  HOME EXERCISE PROGRAM:  ASSESSMENT:  CLINICAL IMPRESSION: Pt arrives for today's treatment session denying any pain, but does report increased fatigue  today, unsure of cause.  Due to increased fatigue pt opted to perform seated exercises today versus standing.  Pt able to tolerate increased reps or resistance with all seated exercises today.  Pt denied any pain at completion of today's treatment session, but does report mild increase in fatigue.  OBJECTIVE IMPAIRMENTS: Abnormal gait, decreased activity tolerance, decreased balance, decreased coordination, decreased mobility, difficulty walking, decreased strength, postural dysfunction, and pain.   ACTIVITY LIMITATIONS: carrying, lifting, bending, standing, stairs, and locomotion level  PARTICIPATION LIMITATIONS: meal prep, cleaning, medication management, and yard work  PERSONAL FACTORS: Time since onset of injury/illness/exacerbation and 1-2 comorbidities: chronic foot drop and previous lumbar surgery  are also affecting patient's functional outcome.   REHAB POTENTIAL: Good  CLINICAL DECISION MAKING: Stable/uncomplicated  EVALUATION COMPLEXITY: Moderate  GOALS:  SHORT TERM GOALS: Target date: 10/29/22.  Ind with an initial HEP. Goal status: INITIAL  LONG TERM GOALS: Target date: 11/26/22  Ind with an advanced HEP.  Goal status: INITIAL  2.  Increase bilateral hip and and knee strength to a solid 4+/5 to increase stability for functional tasks.  Goal status: INITIAL  3.  Perform a reciprocating stair gait with one railing.  Goal status: INITIAL  4.  Improve TUG to 17 seconds.  Goal status: INITIAL  5.  Improve 5 time sit to stand test to 15 seconds.  Goal status: INITIAL  PLAN:  PT FREQUENCY: 2x/week  PT DURATION: 6 weeks  PLANNED INTERVENTIONS: Therapeutic exercises, Therapeutic activity, Neuromuscular re-education, Balance training, Gait training, Patient/Family education, Self Care, Electrical stimulation, Cryotherapy, Moist heat, Ultrasound, and Manual therapy.  PLAN FOR NEXT SESSION: Balance and gait training, core exercise progression, LE strengthening.   Neuro-re-education.   Newman Pies, PTA 10/28/2022, 10:10 AM

## 2022-10-30 ENCOUNTER — Ambulatory Visit: Payer: Commercial Managed Care - PPO

## 2022-10-30 DIAGNOSIS — M6281 Muscle weakness (generalized): Secondary | ICD-10-CM

## 2022-10-30 DIAGNOSIS — R2681 Unsteadiness on feet: Secondary | ICD-10-CM

## 2022-10-30 DIAGNOSIS — M5459 Other low back pain: Secondary | ICD-10-CM | POA: Diagnosis not present

## 2022-10-30 NOTE — Therapy (Signed)
OUTPATIENT PHYSICAL THERAPY THORACOLUMBAR TREATMENT   Patient Name: Frank Herrera MRN: [id number] DOB:1965-08-12, 57 y.o., male Today's Date: 10/30/2022  END OF SESSION:  PT End of Session - 10/30/22 0802     Visit Number 5    Number of Visits 12    Date for PT Re-Evaluation 11/26/22    Authorization Type FOTO.    PT Start Time 0800    PT Stop Time 0844    PT Time Calculation (min) 44 min    Activity Tolerance Patient tolerated treatment well    Behavior During Therapy [facility] for tasks assessed/performed            Past Medical History:  Diagnosis Date   Hypertension    Lead exposure 03/19/2015   Past Surgical History:  Procedure Laterality Date   HERNIA REPAIR  1990   SPINE SURGERY  2010   Patient Active Problem List   Diagnosis Date Noted   Family history of bicuspid aortic valve 04/17/2020   Daytime somnolence 04/17/2020   History of adenomatous polyp of colon 09/07/2017   Bilateral foot-drop 04/12/2016   Cardiac enzymes elevated 09/12/2015   Hypokalemia 09/12/2015   Lightheadedness 09/12/2015   Essential hypertension 03/19/2015   Primary gout 03/19/2015   Onychomycosis 03/19/2015   Severe obesity (BMI >= 40) (HCC) 03/19/2015   REFERRING PROVIDER: Lisbeth Renshaw MD  REFERRING DIAG: Lumbar stenosis with neurogenic claudication  Rationale for Evaluation and Treatment: Rehabilitation  THERAPY DIAG:  Other low back pain  Muscle weakness (generalized)  Unsteadiness on feet  ONSET DATE: Ongoing.  SUBJECTIVE:                                                                                                                                                                                           SUBJECTIVE STATEMENT: Pt reports feeling better today, denying any pain.    PERTINENT HISTORY:  Prior lumbar surgery, bilateral foot drop.  PAIN:  Are you having pain? No  PRECAUTIONS: Fall  Please monitor gait at all times.  RED FLAGS: None   WEIGHT  BEARING RESTRICTIONS: No  FALLS:  Has patient fallen in last 6 months? Yes. Number of falls 2-3.  PATIENT GOALS: Wants to get stronger and walk better.  OBJECTIVE:   PATIENT SURVEYS:  FOTO .  POSTURE: rounded shoulders and forward head  PALPATION: No significant tenderness in lumbar region  LOWER EXTREMITY ROM:     WF/NL.  LOWER EXTREMITY MMT:    MMT Right eval Left eval  Hip flexion 4-/5 4-/5  Hip extension 4-/5 4-/5  Hip abduction 4-/5 4-/5  Knee flexion 4-/5 4-/5  Knee extension  4/5 4-/5(shaky)  Ankle dorsiflexion 0/5 0/5  Ankle plantarflexion 4+/5 4+/5   (Blank rows = not tested)   FUNCTIONAL TESTS:  5 times sit to stand: 21 seconds Timed up and go (TUG): 24 seconds Romberg test (-) though supervision required.  GAIT: The patient walks with an ataxic gait and a tendency keeps his hip forward of his trunk with a steppage-type gait that is cautious and purposeful using a straight cane for safety.  TODAY'S TREATMENT:                                                                                                                              DATE: 10/30/22  EXERCISE LOG  Exercise Repetitions and Resistance Comments  Nustep Level 3 x 15 mins; seat 11   LAQs 3# x 25 reps bil w hold at top   Seated hip flexion 3# x 25 reps bil   Standing hip abduction    Heel raises X30 reps   Seated Ham Curl Red x 30 reps bil   Seated Hip Adduction x3 mins   Seated Hip Abduction Red x 3 mins   Seated Toe Tap 6" x 2 mins    Blank cell = exercise not performed today   PATIENT EDUCATION:  Education details: Discussed progression into gait, balance and strengthening program. Person educated: Patient Education method: Medical illustrator Education comprehension: verbalized understanding  HOME EXERCISE PROGRAM:  ASSESSMENT:  CLINICAL IMPRESSION: Pt arrives for today's treatment session denying any pain.  Pt able to increase FOTO score to 54 today.  Pt able to  perform TUG in 20.3 seconds making progress towards his long term goal and the 5 STS in 14.2 seconds.  Pt is making progress towards all of his goals at this time.  Pt able to tolerate increased reps with numerous exercises today with minimal fatigue reported.  Pt denied any pain at completion of today's treatment session.  OBJECTIVE IMPAIRMENTS: Abnormal gait, decreased activity tolerance, decreased balance, decreased coordination, decreased mobility, difficulty walking, decreased strength, postural dysfunction, and pain.   ACTIVITY LIMITATIONS: carrying, lifting, bending, standing, stairs, and locomotion level  PARTICIPATION LIMITATIONS: meal prep, cleaning, medication management, and yard work  PERSONAL FACTORS: Time since onset of injury/illness/exacerbation and 1-2 comorbidities: chronic foot drop and previous lumbar surgery  are also affecting patient's functional outcome.   REHAB POTENTIAL: Good  CLINICAL DECISION MAKING: Stable/uncomplicated  EVALUATION COMPLEXITY: Moderate  GOALS:  SHORT TERM GOALS: Target date: 10/29/22.  Ind with an initial HEP. Goal status: MET  LONG TERM GOALS: Target date: 11/26/22  Ind with an advanced HEP.  Goal status: IN PROGRESS  2.  Increase bilateral hip and and knee strength to a solid 4+/5 to increase stability for functional tasks.  Goal status: IN PROGRESS  3.  Perform a reciprocating stair gait with one railing.  Goal status: IN PROGRESS  4.  Improve TUG to 17 seconds.   8/22: 20.3 seconds Goal status: IN  PROGRESS  5.  Improve 5 time sit to stand test to 15 seconds.   8/22: 14.2 seconds Goal status: MET  PLAN:  PT FREQUENCY: 2x/week  PT DURATION: 6 weeks  PLANNED INTERVENTIONS: Therapeutic exercises, Therapeutic activity, Neuromuscular re-education, Balance training, Gait training, Patient/Family education, Self Care, Electrical stimulation, Cryotherapy, Moist heat, Ultrasound, and Manual therapy.  PLAN FOR NEXT SESSION:  Balance and gait training, core exercise progression, LE strengthening.  Neuro-re-education.   Newman Pies, PTA 10/30/2022, 10:23 AM

## 2022-11-04 ENCOUNTER — Ambulatory Visit: Payer: Commercial Managed Care - PPO

## 2022-11-04 DIAGNOSIS — R2681 Unsteadiness on feet: Secondary | ICD-10-CM

## 2022-11-04 DIAGNOSIS — M5459 Other low back pain: Secondary | ICD-10-CM

## 2022-11-04 DIAGNOSIS — M6281 Muscle weakness (generalized): Secondary | ICD-10-CM

## 2022-11-04 NOTE — Therapy (Signed)
OUTPATIENT PHYSICAL THERAPY THORACOLUMBAR TREATMENT   Patient Name: Frank Herrera MRN: [id number] DOB:Jul 27, 1965, 57 y.o., male Today's Date: 11/04/2022  END OF SESSION:  PT End of Session - 11/04/22 0804     Visit Number 6    Number of Visits 12    Date for PT Re-Evaluation 11/26/22    Authorization Type FOTO.    PT Start Time 0801    PT Stop Time 0845    PT Time Calculation (min) 44 min    Activity Tolerance Patient tolerated treatment well    Behavior During Therapy [facility] for tasks assessed/performed             Past Medical History:  Diagnosis Date   Hypertension    Lead exposure 03/19/2015   Past Surgical History:  Procedure Laterality Date   HERNIA REPAIR  1990   SPINE SURGERY  2010   Patient Active Problem List   Diagnosis Date Noted   Family history of bicuspid aortic valve 04/17/2020   Daytime somnolence 04/17/2020   History of adenomatous polyp of colon 09/07/2017   Bilateral foot-drop 04/12/2016   Cardiac enzymes elevated 09/12/2015   Hypokalemia 09/12/2015   Lightheadedness 09/12/2015   Essential hypertension 03/19/2015   Primary gout 03/19/2015   Onychomycosis 03/19/2015   Severe obesity (BMI >= 40) (HCC) 03/19/2015   REFERRING PROVIDER: Lisbeth Renshaw MD  REFERRING DIAG: Lumbar stenosis with neurogenic claudication  Rationale for Evaluation and Treatment: Rehabilitation  THERAPY DIAG:  Other low back pain  Muscle weakness (generalized)  Unsteadiness on feet  ONSET DATE: Ongoing.  SUBJECTIVE:                                                                                                                                                                                           SUBJECTIVE STATEMENT: Patient reports that he feels pretty good today.   PERTINENT HISTORY:  Prior lumbar surgery, bilateral foot drop.  PAIN:  Are you having pain? No  PRECAUTIONS: Fall  Please monitor gait at all times.  RED FLAGS: None   WEIGHT  BEARING RESTRICTIONS: No  FALLS:  Has patient fallen in last 6 months? Yes. Number of falls 2-3.  PATIENT GOALS: Wants to get stronger and walk better.  OBJECTIVE:   PATIENT SURVEYS:  FOTO .  POSTURE: rounded shoulders and forward head  PALPATION: No significant tenderness in lumbar region  LOWER EXTREMITY ROM:     WF/NL.  LOWER EXTREMITY MMT:    MMT Right eval Left eval  Hip flexion 4-/5 4-/5  Hip extension 4-/5 4-/5  Hip abduction 4-/5 4-/5  Knee flexion 4-/5 4-/5  Knee extension  4/5 4-/5(shaky)  Ankle dorsiflexion 0/5 0/5  Ankle plantarflexion 4+/5 4+/5   (Blank rows = not tested)   FUNCTIONAL TESTS:  5 times sit to stand: 21 seconds Timed up and go (TUG): 24 seconds Romberg test (-) though supervision required.  GAIT: The patient walks with an ataxic gait and a tendency keeps his hip forward of his trunk with a steppage-type gait that is cautious and purposeful using a straight cane for safety.  TODAY'S TREATMENT:                                                                                                                              DATE:                                   11/04/22 EXERCISE LOG  Exercise Repetitions and Resistance Comments  Nustep  L4 x 15 minutes   Wide BOS on foam  2 minutes   Toe taps son step  8" step x 3 minutes   LAQ 4# x 3 minutes Alternating LE  Seated hip flexion  4# x 3 minutes Alternating LE  Seated hip ADD isometric  3 minutes w/ 5 second hold    Sit to stand 5 reps  From elevated mat table   Blank cell = exercise not performed today    10/30/22  EXERCISE LOG  Exercise Repetitions and Resistance Comments  Nustep Level 3 x 15 mins; seat 11   LAQs 3# x 25 reps bil w hold at top   Seated hip flexion 3# x 25 reps bil   Standing hip abduction    Heel raises X30 reps   Seated Ham Curl Red x 30 reps bil   Seated Hip Adduction x3 mins   Seated Hip Abduction Red x 3 mins   Seated Toe Tap 6" x 2 mins    Blank cell =  exercise not performed today   PATIENT EDUCATION:  Education details: Discussed progression into gait, balance and strengthening program. Person educated: Patient Education method: Medical illustrator Education comprehension: verbalized understanding  HOME EXERCISE PROGRAM:  ASSESSMENT:  CLINICAL IMPRESSION: Patient was introduced to multiple new standing interventions. He required minimal cueing with today's new interventions for proper exercise performance. He experienced no significant pain or discomfort with any of today's interventions. He reported that he was "feeling it " upon the conclusion of treatment. He continues to require skilled physical therapy to address his remaining impairments to maximize his functional mobility.   OBJECTIVE IMPAIRMENTS: Abnormal gait, decreased activity tolerance, decreased balance, decreased coordination, decreased mobility, difficulty walking, decreased strength, postural dysfunction, and pain.   ACTIVITY LIMITATIONS: carrying, lifting, bending, standing, stairs, and locomotion level  PARTICIPATION LIMITATIONS: meal prep, cleaning, medication management, and yard work  PERSONAL FACTORS: Time since onset of injury/illness/exacerbation and 1-2 comorbidities: chronic foot drop and previous lumbar surgery  are also affecting  patient's functional outcome.   REHAB POTENTIAL: Good  CLINICAL DECISION MAKING: Stable/uncomplicated  EVALUATION COMPLEXITY: Moderate  GOALS:  SHORT TERM GOALS: Target date: 10/29/22.  Ind with an initial HEP. Goal status: MET  LONG TERM GOALS: Target date: 11/26/22  Ind with an advanced HEP.  Goal status: IN PROGRESS  2.  Increase bilateral hip and and knee strength to a solid 4+/5 to increase stability for functional tasks.  Goal status: IN PROGRESS  3.  Perform a reciprocating stair gait with one railing.  Goal status: IN PROGRESS  4.  Improve TUG to 17 seconds.   8/22: 20.3 seconds Goal status: IN  PROGRESS  5.  Improve 5 time sit to stand test to 15 seconds.   8/22: 14.2 seconds Goal status: MET  PLAN:  PT FREQUENCY: 2x/week  PT DURATION: 6 weeks  PLANNED INTERVENTIONS: Therapeutic exercises, Therapeutic activity, Neuromuscular re-education, Balance training, Gait training, Patient/Family education, Self Care, Electrical stimulation, Cryotherapy, Moist heat, Ultrasound, and Manual therapy.  PLAN FOR NEXT SESSION: Balance and gait training, core exercise progression, LE strengthening.  Neuro-re-education.   Granville Lewis, PT 11/04/2022, 9:02 AM

## 2022-11-06 ENCOUNTER — Ambulatory Visit: Payer: Commercial Managed Care - PPO

## 2022-11-06 DIAGNOSIS — M5459 Other low back pain: Secondary | ICD-10-CM | POA: Diagnosis not present

## 2022-11-06 DIAGNOSIS — M6281 Muscle weakness (generalized): Secondary | ICD-10-CM

## 2022-11-06 DIAGNOSIS — R2681 Unsteadiness on feet: Secondary | ICD-10-CM

## 2022-11-06 NOTE — Therapy (Signed)
OUTPATIENT PHYSICAL THERAPY THORACOLUMBAR TREATMENT   Patient Name: Frank Herrera MRN: [id number] DOB:02-Sep-1965, 57 y.o., male Today's Date: 11/06/2022  END OF SESSION:  PT End of Session - 11/06/22 0813     Visit Number 7    Number of Visits 12    Date for PT Re-Evaluation 11/26/22    Authorization Type FOTO.    PT Start Time 0800    PT Stop Time 0845    PT Time Calculation (min) 45 min    Activity Tolerance Patient tolerated treatment well    Behavior During Therapy [facility] for tasks assessed/performed             Past Medical History:  Diagnosis Date   Hypertension    Lead exposure 03/19/2015   Past Surgical History:  Procedure Laterality Date   HERNIA REPAIR  1990   SPINE SURGERY  2010   Patient Active Problem List   Diagnosis Date Noted   Family history of bicuspid aortic valve 04/17/2020   Daytime somnolence 04/17/2020   History of adenomatous polyp of colon 09/07/2017   Bilateral foot-drop 04/12/2016   Cardiac enzymes elevated 09/12/2015   Hypokalemia 09/12/2015   Lightheadedness 09/12/2015   Essential hypertension 03/19/2015   Primary gout 03/19/2015   Onychomycosis 03/19/2015   Severe obesity (BMI >= 40) (HCC) 03/19/2015   REFERRING PROVIDER: Lisbeth Renshaw MD  REFERRING DIAG: Lumbar stenosis with neurogenic claudication  Rationale for Evaluation and Treatment: Rehabilitation  THERAPY DIAG:  Other low back pain  Muscle weakness (generalized)  Unsteadiness on feet  ONSET DATE: Ongoing.  SUBJECTIVE:                                                                                                                                                                                           SUBJECTIVE STATEMENT: Patient reports that he feels good today.     PERTINENT HISTORY:  Prior lumbar surgery, bilateral foot drop.  PAIN:  Are you having pain? No  PRECAUTIONS: Fall  Please monitor gait at all times.  RED FLAGS: None   WEIGHT BEARING  RESTRICTIONS: No  FALLS:  Has patient fallen in last 6 months? Yes. Number of falls 2-3.  PATIENT GOALS: Wants to get stronger and walk better.  OBJECTIVE:   PATIENT SURVEYS:  FOTO .  POSTURE: rounded shoulders and forward head  PALPATION: No significant tenderness in lumbar region  LOWER EXTREMITY ROM:     WF/NL.  LOWER EXTREMITY MMT:    MMT Right eval Left eval  Hip flexion 4-/5 4-/5  Hip extension 4-/5 4-/5  Hip abduction 4-/5 4-/5  Knee flexion 4-/5 4-/5  Knee  extension 4/5 4-/5(shaky)  Ankle dorsiflexion 0/5 0/5  Ankle plantarflexion 4+/5 4+/5   (Blank rows = not tested)   FUNCTIONAL TESTS:  5 times sit to stand: 21 seconds Timed up and go (TUG): 24 seconds Romberg test (-) though supervision required.  GAIT: The patient walks with an ataxic gait and a tendency keeps his hip forward of his trunk with a steppage-type gait that is cautious and purposeful using a straight cane for safety.  TODAY'S TREATMENT:                                                                                                                              DATE:                                   11/06/22 EXERCISE LOG  Exercise Repetitions and Resistance Comments  Nustep  L4 x 15 minutes   NBOS on Airex 3 mins   Rockerboard 3 mins   Toe taps on step  8" step x 3 minutes   LAQ 5# x 2 minutes Alternating LE  Seated hip flexion  5# x 2 minutes Alternating LE  Seated hip ADD isometric  3.5 minutes w/ 5 second hold    Seated hip ABD Green x 3 mins   Seated Ham Curls Green x 25 reps bil   Sit to stand 5 reps  From elevated mat table   Blank cell = exercise not performed today   PATIENT EDUCATION:  Education details: Discussed progression into gait, balance and strengthening program. Person educated: Patient Education method: Medical illustrator Education comprehension: verbalized understanding  HOME EXERCISE PROGRAM:  ASSESSMENT:  CLINICAL IMPRESSION: Pt arrives for  today's treatment session reporting minimal pain.  Pt able to tolerate increased time, reps, or weight with all exercises today.  Pt given seated rest breaks as needed due to fatigue, but no complaints of pain.  Pt denied any pain at completion of today's treatment session.   OBJECTIVE IMPAIRMENTS: Abnormal gait, decreased activity tolerance, decreased balance, decreased coordination, decreased mobility, difficulty walking, decreased strength, postural dysfunction, and pain.   ACTIVITY LIMITATIONS: carrying, lifting, bending, standing, stairs, and locomotion level  PARTICIPATION LIMITATIONS: meal prep, cleaning, medication management, and yard work  PERSONAL FACTORS: Time since onset of injury/illness/exacerbation and 1-2 comorbidities: chronic foot drop and previous lumbar surgery  are also affecting patient's functional outcome.   REHAB POTENTIAL: Good  CLINICAL DECISION MAKING: Stable/uncomplicated  EVALUATION COMPLEXITY: Moderate  GOALS:  SHORT TERM GOALS: Target date: 10/29/22.  Ind with an initial HEP. Goal status: MET  LONG TERM GOALS: Target date: 11/26/22  Ind with an advanced HEP.  Goal status: IN PROGRESS  2.  Increase bilateral hip and and knee strength to a solid 4+/5 to increase stability for functional tasks.  Goal status: IN PROGRESS  3.  Perform a reciprocating stair gait with one railing.  Goal status: IN PROGRESS  4.  Improve TUG to 17 seconds.   8/22: 20.3 seconds Goal status: IN PROGRESS  5.  Improve 5 time sit to stand test to 15 seconds.   8/22: 14.2 seconds Goal status: MET  PLAN:  PT FREQUENCY: 2x/week  PT DURATION: 6 weeks  PLANNED INTERVENTIONS: Therapeutic exercises, Therapeutic activity, Neuromuscular re-education, Balance training, Gait training, Patient/Family education, Self Care, Electrical stimulation, Cryotherapy, Moist heat, Ultrasound, and Manual therapy.  PLAN FOR NEXT SESSION: Balance and gait training, core exercise progression,  LE strengthening.  Neuro-re-education.   Newman Pies, PTA 11/06/2022, 10:23 AM

## 2022-11-11 ENCOUNTER — Ambulatory Visit: Payer: Commercial Managed Care - PPO | Attending: Neurosurgery

## 2022-11-11 DIAGNOSIS — M6281 Muscle weakness (generalized): Secondary | ICD-10-CM | POA: Insufficient documentation

## 2022-11-11 DIAGNOSIS — R2681 Unsteadiness on feet: Secondary | ICD-10-CM | POA: Diagnosis present

## 2022-11-11 DIAGNOSIS — M5459 Other low back pain: Secondary | ICD-10-CM | POA: Insufficient documentation

## 2022-11-11 NOTE — Therapy (Signed)
OUTPATIENT PHYSICAL THERAPY THORACOLUMBAR TREATMENT   Patient Name: Frank Herrera MRN: [id number] DOB:1965/12/27, 57 y.o., male Today's Date: 11/11/2022  END OF SESSION:  PT End of Session - 11/11/22 0853     Visit Number 8    Number of Visits 12    Date for PT Re-Evaluation 11/26/22    Authorization Type FOTO.    PT Start Time 0845    PT Stop Time 0930    PT Time Calculation (min) 45 min    Activity Tolerance Patient tolerated treatment well    Behavior During Therapy [facility] for tasks assessed/performed             Past Medical History:  Diagnosis Date   Hypertension    Lead exposure 03/19/2015   Past Surgical History:  Procedure Laterality Date   HERNIA REPAIR  1990   SPINE SURGERY  2010   Patient Active Problem List   Diagnosis Date Noted   Family history of bicuspid aortic valve 04/17/2020   Daytime somnolence 04/17/2020   History of adenomatous polyp of colon 09/07/2017   Bilateral foot-drop 04/12/2016   Cardiac enzymes elevated 09/12/2015   Hypokalemia 09/12/2015   Lightheadedness 09/12/2015   Essential hypertension 03/19/2015   Primary gout 03/19/2015   Onychomycosis 03/19/2015   Severe obesity (BMI >= 40) (HCC) 03/19/2015   REFERRING PROVIDER: Lisbeth Renshaw MD  REFERRING DIAG: Lumbar stenosis with neurogenic claudication  Rationale for Evaluation and Treatment: Rehabilitation  THERAPY DIAG:  Other low back pain  Muscle weakness (generalized)  Unsteadiness on feet  ONSET DATE: Ongoing.  SUBJECTIVE:                                                                                                                                                                                           SUBJECTIVE STATEMENT: Patient denies any pain today.   PERTINENT HISTORY:  Prior lumbar surgery, bilateral foot drop.  PAIN:  Are you having pain? No  PRECAUTIONS: Fall  Please monitor gait at all times.  RED FLAGS: None   WEIGHT BEARING RESTRICTIONS:  No  FALLS:  Has patient fallen in last 6 months? Yes. Number of falls 2-3.  PATIENT GOALS: Wants to get stronger and walk better.  OBJECTIVE:   PATIENT SURVEYS:  FOTO .  POSTURE: rounded shoulders and forward head  PALPATION: No significant tenderness in lumbar region  LOWER EXTREMITY ROM:     WF/NL.  LOWER EXTREMITY MMT:    MMT Right eval Left eval  Hip flexion 4-/5 4-/5  Hip extension 4-/5 4-/5  Hip abduction 4-/5 4-/5  Knee flexion 4-/5 4-/5  Knee extension 4/5 4-/5(shaky)  Ankle dorsiflexion 0/5 0/5  Ankle plantarflexion 4+/5 4+/5   (Blank rows = not tested)   FUNCTIONAL TESTS:  5 times sit to stand: 21 seconds Timed up and go (TUG): 24 seconds Romberg test (-) though supervision required.  GAIT: The patient walks with an ataxic gait and a tendency keeps his hip forward of his trunk with a steppage-type gait that is cautious and purposeful using a straight cane for safety.  TODAY'S TREATMENT:                                                                                                                              DATE:                                   11/11/22 EXERCISE LOG  Exercise Repetitions and Resistance Comments  Nustep  L4 x 15 minutes   NBOS on Airex 3 mins   Rockerboard 3.5 mins   Toe taps on step  8" step x 3 minutes   LAQ 5# x 3 minutes Alternating LE  Seated hip flexion  5# x 2.5 minutes Alternating LE  Seated hip ADD isometric  3.5 minutes w/ 5 second hold    Seated hip ABD Green x 3.5 mins   Seated Ham Curls Green x 25 reps bil   Sit to stand 10 reps  From elevated mat table   Blank cell = exercise not performed today   PATIENT EDUCATION:  Education details: Discussed progression into gait, balance and strengthening program. Person educated: Patient Education method: Medical illustrator Education comprehension: verbalized understanding  HOME EXERCISE PROGRAM:  ASSESSMENT:  CLINICAL IMPRESSION: Pt arrives for today's  treatment session denying any pain.  Pt challenged by standing airex and rockerboard activities today with increase pain in right ankle.  Pt able to tolerate increased time or reps with all seated exercises today with fatigue noted.  Pt reported slight increase in pain today at completion of today's treatment session.  OBJECTIVE IMPAIRMENTS: Abnormal gait, decreased activity tolerance, decreased balance, decreased coordination, decreased mobility, difficulty walking, decreased strength, postural dysfunction, and pain.   ACTIVITY LIMITATIONS: carrying, lifting, bending, standing, stairs, and locomotion level  PARTICIPATION LIMITATIONS: meal prep, cleaning, medication management, and yard work  PERSONAL FACTORS: Time since onset of injury/illness/exacerbation and 1-2 comorbidities: chronic foot drop and previous lumbar surgery  are also affecting patient's functional outcome.   REHAB POTENTIAL: Good  CLINICAL DECISION MAKING: Stable/uncomplicated  EVALUATION COMPLEXITY: Moderate  GOALS:  SHORT TERM GOALS: Target date: 10/29/22.  Ind with an initial HEP. Goal status: MET  LONG TERM GOALS: Target date: 11/26/22  Ind with an advanced HEP.  Goal status: IN PROGRESS  2.  Increase bilateral hip and and knee strength to a solid 4+/5 to increase stability for functional tasks.  Goal status: IN PROGRESS  3.  Perform a reciprocating stair gait with one railing.  Goal status: IN PROGRESS  4.  Improve TUG to 17 seconds.   8/22: 20.3 seconds Goal status: IN PROGRESS  5.  Improve 5 time sit to stand test to 15 seconds.   8/22: 14.2 seconds Goal status: MET  PLAN:  PT FREQUENCY: 2x/week  PT DURATION: 6 weeks  PLANNED INTERVENTIONS: Therapeutic exercises, Therapeutic activity, Neuromuscular re-education, Balance training, Gait training, Patient/Family education, Self Care, Electrical stimulation, Cryotherapy, Moist heat, Ultrasound, and Manual therapy.  PLAN FOR NEXT SESSION: Balance  and gait training, core exercise progression, LE strengthening.  Neuro-re-education.   Newman Pies, PTA 11/11/2022, 9:41 AM

## 2022-11-13 ENCOUNTER — Ambulatory Visit: Payer: Commercial Managed Care - PPO

## 2022-11-13 DIAGNOSIS — M5459 Other low back pain: Secondary | ICD-10-CM

## 2022-11-13 DIAGNOSIS — M6281 Muscle weakness (generalized): Secondary | ICD-10-CM

## 2022-11-13 DIAGNOSIS — R2681 Unsteadiness on feet: Secondary | ICD-10-CM

## 2022-11-13 NOTE — Therapy (Signed)
OUTPATIENT PHYSICAL THERAPY THORACOLUMBAR TREATMENT   Patient Name: Frank Herrera MRN: [id number] DOB:10-Mar-1966, 57 y.o., male Today's Date: 11/13/2022  END OF SESSION:  PT End of Session - 11/13/22 0806     Visit Number 9    Number of Visits 12    Date for PT Re-Evaluation 11/26/22    Authorization Type FOTO.    PT Start Time 0800    PT Stop Time 0845    PT Time Calculation (min) 45 min    Activity Tolerance Patient tolerated treatment well    Behavior During Therapy [facility] for tasks assessed/performed             Past Medical History:  Diagnosis Date   Hypertension    Lead exposure 03/19/2015   Past Surgical History:  Procedure Laterality Date   HERNIA REPAIR  1990   SPINE SURGERY  2010   Patient Active Problem List   Diagnosis Date Noted   Family history of bicuspid aortic valve 04/17/2020   Daytime somnolence 04/17/2020   History of adenomatous polyp of colon 09/07/2017   Bilateral foot-drop 04/12/2016   Cardiac enzymes elevated 09/12/2015   Hypokalemia 09/12/2015   Lightheadedness 09/12/2015   Essential hypertension 03/19/2015   Primary gout 03/19/2015   Onychomycosis 03/19/2015   Severe obesity (BMI >= 40) (HCC) 03/19/2015   REFERRING PROVIDER: Lisbeth Renshaw MD  REFERRING DIAG: Lumbar stenosis with neurogenic claudication  Rationale for Evaluation and Treatment: Rehabilitation  THERAPY DIAG:  Other low back pain  Unsteadiness on feet  Muscle weakness (generalized)  ONSET DATE: Ongoing.  SUBJECTIVE:                                                                                                                                                                                           SUBJECTIVE STATEMENT: Pt reports 3/10 bil foot pain. Pt reports that his pain got up to a 6/10 yesterday.  PERTINENT HISTORY:  Prior lumbar surgery, bilateral foot drop.  PAIN:  Are you having pain? Yes: NPRS scale: 3/10 Pain location: bil feet  PRECAUTIONS:  Fall  Please monitor gait at all times.  RED FLAGS: None   WEIGHT BEARING RESTRICTIONS: No  FALLS:  Has patient fallen in last 6 months? Yes. Number of falls 2-3.  PATIENT GOALS: Wants to get stronger and walk better.  OBJECTIVE:   PATIENT SURVEYS:  FOTO .  POSTURE: rounded shoulders and forward head  PALPATION: No significant tenderness in lumbar region  LOWER EXTREMITY ROM:     WF/NL.  LOWER EXTREMITY MMT:    MMT Right eval Left eval  Hip flexion 4-/5 4-/5  Hip extension  4-/5 4-/5  Hip abduction 4-/5 4-/5  Knee flexion 4-/5 4-/5  Knee extension 4/5 4-/5(shaky)  Ankle dorsiflexion 0/5 0/5  Ankle plantarflexion 4+/5 4+/5   (Blank rows = not tested)   FUNCTIONAL TESTS:  5 times sit to stand: 21 seconds Timed up and go (TUG): 24 seconds Romberg test (-) though supervision required.  GAIT: The patient walks with an ataxic gait and a tendency keeps his hip forward of his trunk with a steppage-type gait that is cautious and purposeful using a straight cane for safety.  TODAY'S TREATMENT:                                                                                                                              DATE:                                   11/13/22 EXERCISE LOG  Exercise Repetitions and Resistance Comments  Nustep  L4 x 15 minutes   NBOS  Ball on Airex x 2 mins   Rockerboard    Toe taps on step     LAQ 5# x 3.5 minutes Alternating LE  Seated hip flexion  5# x 3 minutes Alternating LE  Seated hip ADD isometric  3.5 minutes w/ 5 second hold    Seated hip ABD Blue x 2.5 mins   Seated Ham Curls Green x 30 reps bil   Sit to stand 10 reps  From elevated mat table   Blank cell = exercise not performed today   PATIENT EDUCATION:  Education details: Discussed progression into gait, balance and strengthening program. Person educated: Patient Education method: Medical illustrator Education comprehension: verbalized understanding  HOME  EXERCISE PROGRAM:  ASSESSMENT:  CLINICAL IMPRESSION: Pt arrives for today's treatment session reporting 3/10 bil foot pain.  Pt reports that pain got up to 6/10 after last treatment session.  Pt introduced to standing balance activities with NBOS holding Airex pad and balancing small ball.  Pt challenged by activity.  Pt able to tolerate increased resistance with seated hip abduction.  Pt denied any change in pain at completion of today's treatment session.  OBJECTIVE IMPAIRMENTS: Abnormal gait, decreased activity tolerance, decreased balance, decreased coordination, decreased mobility, difficulty walking, decreased strength, postural dysfunction, and pain.   ACTIVITY LIMITATIONS: carrying, lifting, bending, standing, stairs, and locomotion level  PARTICIPATION LIMITATIONS: meal prep, cleaning, medication management, and yard work  PERSONAL FACTORS: Time since onset of injury/illness/exacerbation and 1-2 comorbidities: chronic foot drop and previous lumbar surgery  are also affecting patient's functional outcome.   REHAB POTENTIAL: Good  CLINICAL DECISION MAKING: Stable/uncomplicated  EVALUATION COMPLEXITY: Moderate  GOALS:  SHORT TERM GOALS: Target date: 10/29/22.  Ind with an initial HEP. Goal status: MET  LONG TERM GOALS: Target date: 11/26/22  Ind with an advanced HEP.  Goal status: IN PROGRESS  2.  Increase bilateral hip and  and knee strength to a solid 4+/5 to increase stability for functional tasks.  Goal status: IN PROGRESS  3.  Perform a reciprocating stair gait with one railing.  Goal status: IN PROGRESS  4.  Improve TUG to 17 seconds.   8/22: 20.3 seconds Goal status: IN PROGRESS  5.  Improve 5 time sit to stand test to 15 seconds.   8/22: 14.2 seconds Goal status: MET  PLAN:  PT FREQUENCY: 2x/week  PT DURATION: 6 weeks  PLANNED INTERVENTIONS: Therapeutic exercises, Therapeutic activity, Neuromuscular re-education, Balance training, Gait training,  Patient/Family education, Self Care, Electrical stimulation, Cryotherapy, Moist heat, Ultrasound, and Manual therapy.  PLAN FOR NEXT SESSION: Balance and gait training, core exercise progression, LE strengthening.  Neuro-re-education.   Newman Pies, PTA 11/13/2022, 11:17 AM

## 2022-11-18 ENCOUNTER — Ambulatory Visit: Payer: Commercial Managed Care - PPO

## 2022-11-18 DIAGNOSIS — M5459 Other low back pain: Secondary | ICD-10-CM | POA: Diagnosis not present

## 2022-11-18 DIAGNOSIS — R2681 Unsteadiness on feet: Secondary | ICD-10-CM

## 2022-11-18 DIAGNOSIS — M6281 Muscle weakness (generalized): Secondary | ICD-10-CM

## 2022-11-18 NOTE — Therapy (Signed)
OUTPATIENT PHYSICAL THERAPY THORACOLUMBAR TREATMENT   Patient Name: Frank Herrera MRN: [id number] DOB:[birth year], 57 y.o., male Today's Date: 11/18/2022  END OF SESSION:  PT End of Session - 11/18/22 0803     Visit Number 10    Number of Visits 12    Date for PT Re-Evaluation 11/26/22    Authorization Type FOTO.    PT Start Time 0800    PT Stop Time 0845    PT Time Calculation (min) 45 min    Activity Tolerance Patient tolerated treatment well    Behavior During Therapy [facility] for tasks assessed/performed              Past Medical History:  Diagnosis Date   Hypertension    Lead exposure 03/19/2015   Past Surgical History:  Procedure Laterality Date   HERNIA REPAIR  1990   SPINE SURGERY  2010   Patient Active Problem List   Diagnosis Date Noted   Family history of bicuspid aortic valve 04/17/2020   Daytime somnolence 04/17/2020   History of adenomatous polyp of colon 09/07/2017   Bilateral foot-drop 04/12/2016   Cardiac enzymes elevated 09/12/2015   Hypokalemia 09/12/2015   Lightheadedness 09/12/2015   Essential hypertension 03/19/2015   Primary gout 03/19/2015   Onychomycosis 03/19/2015   Severe obesity (BMI >= 40) (HCC) 03/19/2015   REFERRING PROVIDER: Lisbeth Renshaw MD  REFERRING DIAG: Lumbar stenosis with neurogenic claudication  Rationale for Evaluation and Treatment: Rehabilitation  THERAPY DIAG:  Other low back pain  Unsteadiness on feet  Muscle weakness (generalized)  ONSET DATE: Ongoing.  SUBJECTIVE:                                                                                                                                                                                           SUBJECTIVE STATEMENT: Patient reports that he may have "tweaked his back over the weekend." He notes that he was on his feet a lot at a housewarming party this weekend and has been hurting since. He feels that he has been getting a little better since starting  therapy.    PERTINENT HISTORY:  Prior lumbar surgery, bilateral foot drop.  PAIN:  Are you having pain? Yes: NPRS scale: 7/10 Pain location: bil feet  PRECAUTIONS: Fall  Please monitor gait at all times.  RED FLAGS: None   WEIGHT BEARING RESTRICTIONS: No  FALLS:  Has patient fallen in last 6 months? Yes. Number of falls 2-3.  PATIENT GOALS: Wants to get stronger and walk better.  OBJECTIVE:   PATIENT SURVEYS:  FOTO .  POSTURE: rounded shoulders and forward head  PALPATION: No significant tenderness  in lumbar region  LOWER EXTREMITY ROM:     WF/NL.  LOWER EXTREMITY MMT:    MMT Right eval Right 11/18/22 Left eval Left 11/18/22  Hip flexion 4-/5  4-/5   Hip extension 4-/5  4-/5   Hip abduction 4-/5  4-/5   Knee flexion 4-/5 4-/5 4-/5 4-/5  Knee extension 4/5 4/5 4-/5(shaky) 4/5  Ankle dorsiflexion 0/5  0/5   Ankle plantarflexion 4+/5  4+/5    (Blank rows = not tested)   FUNCTIONAL TESTS:  5 times sit to stand: 21 seconds Timed up and go (TUG): 24 seconds Romberg test (-) though supervision required.  GAIT: The patient walks with an ataxic gait and a tendency keeps his hip forward of his trunk with a steppage-type gait that is cautious and purposeful using a straight cane for safety.  TODAY'S TREATMENT:                                                                                                                              DATE:                                   11/18/22 EXERCISE LOG  Exercise Repetitions and Resistance Comments  Nustep  L3 x 15 minutes   Seated hip ADD isometric  3.5 minutes w/ 5 second hold  Added to HEP   LAQ  5# x 3 minutes Added to HEP   Seated marching 5# x 3 minutes Added to HEP   Seated glute sets  3 minutes w/ 5 second hold Added to HEP    Blank cell = exercise not performed today                                    11/13/22 EXERCISE LOG  Exercise Repetitions and Resistance Comments  Nustep  L4 x 15 minutes   NBOS  Ball on  Airex x 2 mins   Rockerboard    Toe taps on step     LAQ 5# x 3.5 minutes Alternating LE  Seated hip flexion  5# x 3 minutes Alternating LE  Seated hip ADD isometric  3.5 minutes w/ 5 second hold    Seated hip ABD Blue x 2.5 mins   Seated Ham Curls Green x 30 reps bil   Sit to stand 10 reps  From elevated mat table   Blank cell = exercise not performed today   PATIENT EDUCATION:  Education details: Discussed progression into gait, balance and strengthening program. Person educated: Patient Education method: Medical illustrator Education comprehension: verbalized understanding  HOME EXERCISE PROGRAM:  ASSESSMENT:  CLINICAL IMPRESSION: Patient is making fair progress with skilled physical therapy as evidenced by his subjective reports, objective measures, functional mobility, and progress toward his goals. He was able to demonstrate improved lower  extremity strength and timed up and go time since his initial evaluation on 10/15/22. However, he was unable to meet his long term goals in these areas. This may be partially attributed due to his increased pain due to prolonged standing and walking over the weekend. He was provided an updated HEP and he was able to properly demonstrate these interventions. He reported feeling comfortable with these interventions. He reported feeling better upon the conclusion of treatment. Recommend that he continue with his current plan of care to address his remaining impairments to maximize his safety and functional mobility.   OBJECTIVE IMPAIRMENTS: Abnormal gait, decreased activity tolerance, decreased balance, decreased coordination, decreased mobility, difficulty walking, decreased strength, postural dysfunction, and pain.   ACTIVITY LIMITATIONS: carrying, lifting, bending, standing, stairs, and locomotion level  PARTICIPATION LIMITATIONS: meal prep, cleaning, medication management, and yard work  PERSONAL FACTORS: Time since onset of  injury/illness/exacerbation and 1-2 comorbidities: chronic foot drop and previous lumbar surgery  are also affecting patient's functional outcome.   REHAB POTENTIAL: Good  CLINICAL DECISION MAKING: Stable/uncomplicated  EVALUATION COMPLEXITY: Moderate  GOALS:  SHORT TERM GOALS: Target date: 10/29/22.  Ind with an initial HEP. Goal status: MET  LONG TERM GOALS: Target date: 11/26/22  Ind with an advanced HEP.  Goal status: IN PROGRESS  2.  Increase bilateral hip and and knee strength to a solid 4+/5 to increase stability for functional tasks.  Goal status: IN PROGRESS  3.  Perform a reciprocating stair gait with one railing.   Baseline: step to pattern Goal status: IN PROGRESS  4.  Improve TUG to 17 seconds.   8/22: 20.3 seconds  11/18/22: 18.32 seconds w/ SPC Goal status: IN PROGRESS  5.  Improve 5 time sit to stand test to 15 seconds.   8/22: 14.2 seconds Goal status: MET  PLAN:  PT FREQUENCY: 2x/week  PT DURATION: 6 weeks  PLANNED INTERVENTIONS: Therapeutic exercises, Therapeutic activity, Neuromuscular re-education, Balance training, Gait training, Patient/Family education, Self Care, Electrical stimulation, Cryotherapy, Moist heat, Ultrasound, and Manual therapy.  PLAN FOR NEXT SESSION: Balance and gait training, core exercise progression, LE strengthening.  Neuro-re-education.   Granville Lewis, PT 11/18/2022, 9:07 AM

## 2022-11-20 ENCOUNTER — Ambulatory Visit: Payer: Commercial Managed Care - PPO

## 2022-11-20 DIAGNOSIS — M5459 Other low back pain: Secondary | ICD-10-CM

## 2022-11-20 DIAGNOSIS — R2681 Unsteadiness on feet: Secondary | ICD-10-CM

## 2022-11-20 DIAGNOSIS — M6281 Muscle weakness (generalized): Secondary | ICD-10-CM

## 2022-11-20 NOTE — Therapy (Signed)
OUTPATIENT PHYSICAL THERAPY THORACOLUMBAR TREATMENT   Patient Name: Frank Herrera MRN: [id number] DOB:08-25-1965, 57 y.o., male Today's Date: 11/20/2022  END OF SESSION:  PT End of Session - 11/20/22 0805     Visit Number 11    Number of Visits 12    Date for PT Re-Evaluation 11/26/22    Authorization Type FOTO.    PT Start Time 0800    PT Stop Time 0845    PT Time Calculation (min) 45 min    Activity Tolerance Patient tolerated treatment well    Behavior During Therapy [facility] for tasks assessed/performed               Past Medical History:  Diagnosis Date   Hypertension    Lead exposure 03/19/2015   Past Surgical History:  Procedure Laterality Date   HERNIA REPAIR  1990   SPINE SURGERY  2010   Patient Active Problem List   Diagnosis Date Noted   Family history of bicuspid aortic valve 04/17/2020   Daytime somnolence 04/17/2020   History of adenomatous polyp of colon 09/07/2017   Bilateral foot-drop 04/12/2016   Cardiac enzymes elevated 09/12/2015   Hypokalemia 09/12/2015   Lightheadedness 09/12/2015   Essential hypertension 03/19/2015   Primary gout 03/19/2015   Onychomycosis 03/19/2015   Severe obesity (BMI >= 40) (HCC) 03/19/2015   REFERRING PROVIDER: Lisbeth Renshaw MD  REFERRING DIAG: Lumbar stenosis with neurogenic claudication  Rationale for Evaluation and Treatment: Rehabilitation  THERAPY DIAG:  Other low back pain  Unsteadiness on feet  Muscle weakness (generalized)  ONSET DATE: Ongoing.  SUBJECTIVE:                                                                                                                                                                                           SUBJECTIVE STATEMENT: Patient reports that he is feeling a little better than he did at his last appointment, but he is still hurting from this weekend.   PERTINENT HISTORY:  Prior lumbar surgery, bilateral foot drop.  PAIN:  Are you having pain? Yes:  NPRS scale: 5/10 Pain location: left hip   PRECAUTIONS: Fall  Please monitor gait at all times.  RED FLAGS: None   WEIGHT BEARING RESTRICTIONS: No  FALLS:  Has patient fallen in last 6 months? Yes. Number of falls 2-3.  PATIENT GOALS: Wants to get stronger and walk better.  OBJECTIVE:   PATIENT SURVEYS:  FOTO .  POSTURE: rounded shoulders and forward head  PALPATION: No significant tenderness in lumbar region  LOWER EXTREMITY ROM:     WF/NL.  LOWER EXTREMITY MMT:    MMT Right  eval Right 11/18/22 Left eval Left 11/18/22  Hip flexion 4-/5  4-/5   Hip extension 4-/5  4-/5   Hip abduction 4-/5  4-/5   Knee flexion 4-/5 4-/5 4-/5 4-/5  Knee extension 4/5 4/5 4-/5(shaky) 4/5  Ankle dorsiflexion 0/5  0/5   Ankle plantarflexion 4+/5  4+/5    (Blank rows = not tested)   FUNCTIONAL TESTS:  5 times sit to stand: 21 seconds Timed up and go (TUG): 24 seconds Romberg test (-) though supervision required.  GAIT: The patient walks with an ataxic gait and a tendency keeps his hip forward of his trunk with a steppage-type gait that is cautious and purposeful using a straight cane for safety.  TODAY'S TREATMENT:                                                                                                                              DATE:                                   11/20/22 EXERCISE LOG  Exercise Repetitions and Resistance Comments  Nustep  L4 x 15 minutes   Lunges onto step  25 reps each    Standing hamstring stretch 3 x 30 seconds each   Standing hamstring curl  25 reps each    Static stance on foam  3 minutes  Narrow BOS; intermittent UE support  Standing marching  20 reps each  BUE support  Standing hip ABD  20 reps each  BUE support   Blank cell = exercise not performed today                                    11/18/22 EXERCISE LOG  Exercise Repetitions and Resistance Comments  Nustep  L3 x 15 minutes   Seated hip ADD isometric  3.5 minutes w/ 5 second  hold  Added to HEP   LAQ  5# x 3 minutes Added to HEP   Seated marching 5# x 3 minutes Added to HEP   Seated glute sets  3 minutes w/ 5 second hold Added to HEP    Blank cell = exercise not performed today                                    11/13/22 EXERCISE LOG  Exercise Repetitions and Resistance Comments  Nustep  L4 x 15 minutes   NBOS  Ball on Airex x 2 mins   Rockerboard    Toe taps on step     LAQ 5# x 3.5 minutes Alternating LE  Seated hip flexion  5# x 3 minutes Alternating LE  Seated hip ADD isometric  3.5 minutes w/ 5 second hold  Seated hip ABD Blue x 2.5 mins   Seated Ham Curls Green x 30 reps bil   Sit to stand 10 reps  From elevated mat table   Blank cell = exercise not performed today   PATIENT EDUCATION:  Education details: Discussed progression into gait, balance and strengthening program. Person educated: Patient Education method: Medical illustrator Education comprehension: verbalized understanding  HOME EXERCISE PROGRAM:  ASSESSMENT:  CLINICAL IMPRESSION: Patient was progressed with multiple new standing interventions for reduced pain and improved tolerance to standing. He required minimal cueing with standing hamstring stretching for proper positioning to facilitate improved soft tissue extension as this helped to reduce his familiar symptoms. He was able to complete all of today's interventions without requiring a seated rest break. He reported feeling tired upon the conclusion of treatment. He continues to require skilled physical therapy to address his remaining impairments to maximize his functional mobility.     OBJECTIVE IMPAIRMENTS: Abnormal gait, decreased activity tolerance, decreased balance, decreased coordination, decreased mobility, difficulty walking, decreased strength, postural dysfunction, and pain.   ACTIVITY LIMITATIONS: carrying, lifting, bending, standing, stairs, and locomotion level  PARTICIPATION LIMITATIONS: meal prep,  cleaning, medication management, and yard work  PERSONAL FACTORS: Time since onset of injury/illness/exacerbation and 1-2 comorbidities: chronic foot drop and previous lumbar surgery  are also affecting patient's functional outcome.   REHAB POTENTIAL: Good  CLINICAL DECISION MAKING: Stable/uncomplicated  EVALUATION COMPLEXITY: Moderate  GOALS:  SHORT TERM GOALS: Target date: 10/29/22.  Ind with an initial HEP. Goal status: MET  LONG TERM GOALS: Target date: 11/26/22  Ind with an advanced HEP.  Goal status: IN PROGRESS  2.  Increase bilateral hip and and knee strength to a solid 4+/5 to increase stability for functional tasks.  Goal status: IN PROGRESS  3.  Perform a reciprocating stair gait with one railing.   Baseline: step to pattern Goal status: IN PROGRESS  4.  Improve TUG to 17 seconds.   8/22: 20.3 seconds  11/18/22: 18.32 seconds w/ SPC Goal status: IN PROGRESS  5.  Improve 5 time sit to stand test to 15 seconds.   8/22: 14.2 seconds Goal status: MET  PLAN:  PT FREQUENCY: 2x/week  PT DURATION: 6 weeks  PLANNED INTERVENTIONS: Therapeutic exercises, Therapeutic activity, Neuromuscular re-education, Balance training, Gait training, Patient/Family education, Self Care, Electrical stimulation, Cryotherapy, Moist heat, Ultrasound, and Manual therapy.  PLAN FOR NEXT SESSION: Balance and gait training, core exercise progression, LE strengthening.  Neuro-re-education.   Granville Lewis, PT 11/20/2022, 10:57 AM

## 2022-11-25 ENCOUNTER — Ambulatory Visit: Payer: Commercial Managed Care - PPO

## 2022-11-25 DIAGNOSIS — R2681 Unsteadiness on feet: Secondary | ICD-10-CM

## 2022-11-25 DIAGNOSIS — M5459 Other low back pain: Secondary | ICD-10-CM

## 2022-11-25 DIAGNOSIS — M6281 Muscle weakness (generalized): Secondary | ICD-10-CM

## 2022-11-25 NOTE — Therapy (Addendum)
 OUTPATIENT PHYSICAL THERAPY THORACOLUMBAR TREATMENT   Patient Name: Frank Herrera MRN: [id number] DOB:[birth year], 57 y.o., male Today's Date: 11/25/2022  END OF SESSION:  PT End of Session - 11/25/22 0803     Visit Number 12    Number of Visits 12    Date for PT Re-Evaluation 11/26/22    Authorization Type FOTO.    PT Start Time 0800    PT Stop Time 0843    PT Time Calculation (min) 43 min    Activity Tolerance Patient tolerated treatment well    Behavior During Therapy [facility] for tasks assessed/performed               Past Medical History:  Diagnosis Date   Hypertension    Lead exposure 03/19/2015   Past Surgical History:  Procedure Laterality Date   HERNIA REPAIR  1990   SPINE SURGERY  2010   Patient Active Problem List   Diagnosis Date Noted   Family history of bicuspid aortic valve 04/17/2020   Daytime somnolence 04/17/2020   History of adenomatous polyp of colon 09/07/2017   Bilateral foot-drop 04/12/2016   Cardiac enzymes elevated 09/12/2015   Hypokalemia 09/12/2015   Lightheadedness 09/12/2015   Essential hypertension 03/19/2015   Primary gout 03/19/2015   Onychomycosis 03/19/2015   Severe obesity (BMI >= 40) (HCC) 03/19/2015   REFERRING PROVIDER: Lisbeth Renshaw MD  REFERRING DIAG: Lumbar stenosis with neurogenic claudication  Rationale for Evaluation and Treatment: Rehabilitation  THERAPY DIAG:  Other low back pain  Unsteadiness on feet  Muscle weakness (generalized)  ONSET DATE: Ongoing.  SUBJECTIVE:                                                                                                                                                                                           SUBJECTIVE STATEMENT: Pt reports minimal pain today.   PERTINENT HISTORY:  Prior lumbar surgery, bilateral foot drop.  PAIN:  Are you having pain? Yes: NPRS scale: 2/10 Pain location: left hip   PRECAUTIONS: Fall  Please monitor gait at all  times.  RED FLAGS: None   WEIGHT BEARING RESTRICTIONS: No  FALLS:  Has patient fallen in last 6 months? Yes. Number of falls 2-3.  PATIENT GOALS: Wants to get stronger and walk better.  OBJECTIVE:   PATIENT SURVEYS:  FOTO .  POSTURE: rounded shoulders and forward head  PALPATION: No significant tenderness in lumbar region  LOWER EXTREMITY ROM:     WF/NL.  LOWER EXTREMITY MMT:    MMT Right eval Right 11/18/22 Left eval Left 11/18/22  Hip flexion 4-/5  4-/5   Hip extension 4-/5  4-/5   Hip abduction 4-/5  4-/5   Knee flexion 4-/5 4-/5 4-/5 4-/5  Knee extension 4/5 4/5 4-/5(shaky) 4/5  Ankle dorsiflexion 0/5  0/5   Ankle plantarflexion 4+/5  4+/5    (Blank rows = not tested)   FUNCTIONAL TESTS:  5 times sit to stand: 21 seconds Timed up and go (TUG): 24 seconds Romberg test (-) though supervision required.  GAIT: The patient walks with an ataxic gait and a tendency keeps his hip forward of his trunk with a steppage-type gait that is cautious and purposeful using a straight cane for safety.  TODAY'S TREATMENT:                                                                                                                              DATE:                                   11/25/22 EXERCISE LOG  Exercise Repetitions and Resistance Comments  Nustep  L4 x 15 minutes   Lunges onto step  25 reps each    Standing hamstring stretch 3 x 30 seconds each   Standing hamstring curl  30 reps each    Static stance on foam  3 minutes  Narrow BOS; intermittent UE support  Standing marching  25 reps each  BUE support  Standing hip ABD  25 reps each  BUE support   Blank cell = exercise not performed today                                    11/18/22 EXERCISE LOG  Exercise Repetitions and Resistance Comments  Nustep  L3 x 15 minutes   Seated hip ADD isometric  3.5 minutes w/ 5 second hold  Added to HEP   LAQ  5# x 3 minutes Added to HEP   Seated marching 5# x 3 minutes Added  to HEP   Seated glute sets  3 minutes w/ 5 second hold Added to HEP    Blank cell = exercise not performed today                                    11/13/22 EXERCISE LOG  Exercise Repetitions and Resistance Comments  Nustep  L4 x 15 minutes   NBOS  Ball on Airex x 2 mins   Rockerboard    Toe taps on step     LAQ 5# x 3.5 minutes Alternating LE  Seated hip flexion  5# x 3 minutes Alternating LE  Seated hip ADD isometric  3.5 minutes w/ 5 second hold    Seated hip ABD Blue x 2.5 mins   Seated Ham Curls Green x 30 reps bil  Sit to stand 10 reps  From elevated mat table   Blank cell = exercise not performed today   PATIENT EDUCATION:  Education details: Discussed progression into gait, balance and strengthening program. Person educated: Patient Education method: Medical illustrator Education comprehension: verbalized understanding  HOME EXERCISE PROGRAM:  ASSESSMENT:  CLINICAL IMPRESSION: Pt arrives for today's treatment session reporting 2/10 low back pain and reports feeling "pretty good."  Pt able to increase FOTO score to 57 today.  Pt able to perform TUG in 15.8 seconds meeting his goal today.  Pt able to demonstrate 4+/5 global BLE strength today meeting that goal as well.  Pt unable to perform reciprocal pattern on stairs at this time, but demonstrates increased strength and safety with stair navigation.  Pt plans to go on hold at this time and will contact the facility with his plans in the next few weeks.  Pt denied any pain at completion of today's treatment session.    OBJECTIVE IMPAIRMENTS: Abnormal gait, decreased activity tolerance, decreased balance, decreased coordination, decreased mobility, difficulty walking, decreased strength, postural dysfunction, and pain.   ACTIVITY LIMITATIONS: carrying, lifting, bending, standing, stairs, and locomotion level  PARTICIPATION LIMITATIONS: meal prep, cleaning, medication management, and yard work  PERSONAL FACTORS:  Time since onset of injury/illness/exacerbation and 1-2 comorbidities: chronic foot drop and previous lumbar surgery  are also affecting patient's functional outcome.   REHAB POTENTIAL: Good  CLINICAL DECISION MAKING: Stable/uncomplicated  EVALUATION COMPLEXITY: Moderate  GOALS:  SHORT TERM GOALS: Target date: 10/29/22.  Ind with an initial HEP. Goal status: MET  LONG TERM GOALS: Target date: 11/26/22  Ind with an advanced HEP.  Goal status: MET  2.  Increase bilateral hip and and knee strength to a solid 4+/5 to increase stability for functional tasks.  Goal status: MET  3.  Perform a reciprocating stair gait with one railing.   Baseline: step to pattern Goal status: IN PROGRESS  4.  Improve TUG to 17 seconds.   8/22: 20.3 seconds  11/18/22: 18.32 seconds w/ SPC 11/25/22: 15.8 seconds w/ SPC Goal status: MET  5.  Improve 5 time sit to stand test to 15 seconds.   8/22: 14.2 seconds Goal status: MET  PLAN:  PT FREQUENCY: 2x/week  PT DURATION: 6 weeks  PLANNED INTERVENTIONS: Therapeutic exercises, Therapeutic activity, Neuromuscular re-education, Balance training, Gait training, Patient/Family education, Self Care, Electrical stimulation, Cryotherapy, Moist heat, Ultrasound, and Manual therapy.  PLAN FOR NEXT SESSION: Balance and gait training, core exercise progression, LE strengthening.  Neuro-re-education.   Newman Pies, PTA 11/25/2022, 8:44 AM

## 2023-03-25 ENCOUNTER — Other Ambulatory Visit: Payer: Self-pay | Admitting: Family Medicine

## 2023-03-25 DIAGNOSIS — M1000 Idiopathic gout, unspecified site: Secondary | ICD-10-CM

## 2023-04-09 ENCOUNTER — Other Ambulatory Visit: Payer: Self-pay | Admitting: Family Medicine

## 2023-04-09 DIAGNOSIS — E785 Hyperlipidemia, unspecified: Secondary | ICD-10-CM

## 2023-04-16 ENCOUNTER — Other Ambulatory Visit: Payer: Self-pay | Admitting: Family Medicine

## 2023-04-16 DIAGNOSIS — I10 Essential (primary) hypertension: Secondary | ICD-10-CM

## 2023-04-19 ENCOUNTER — Other Ambulatory Visit: Payer: Self-pay | Admitting: Family Medicine

## 2023-04-19 DIAGNOSIS — M1000 Idiopathic gout, unspecified site: Secondary | ICD-10-CM

## 2023-04-20 ENCOUNTER — Encounter: Payer: Self-pay | Admitting: Family Medicine

## 2023-04-20 NOTE — Telephone Encounter (Signed)
 Stcks pt NTBS 30-d given 03/25/23

## 2023-04-20 NOTE — Telephone Encounter (Signed)
 Letter sent.

## 2023-04-23 ENCOUNTER — Other Ambulatory Visit: Payer: Self-pay | Admitting: Family Medicine

## 2023-04-23 DIAGNOSIS — M1000 Idiopathic gout, unspecified site: Secondary | ICD-10-CM

## 2023-05-02 ENCOUNTER — Other Ambulatory Visit: Payer: Self-pay | Admitting: Family Medicine

## 2023-05-02 DIAGNOSIS — M5416 Radiculopathy, lumbar region: Secondary | ICD-10-CM

## 2023-05-05 ENCOUNTER — Other Ambulatory Visit: Payer: Self-pay | Admitting: Family Medicine

## 2023-05-05 DIAGNOSIS — E785 Hyperlipidemia, unspecified: Secondary | ICD-10-CM

## 2023-05-05 NOTE — Telephone Encounter (Signed)
 Stacks pt NTBS 30-d given 04/09/23

## 2023-05-06 NOTE — Telephone Encounter (Signed)
 Pt made appt for march 6 and he said he had enough meds to last until then

## 2023-05-09 ENCOUNTER — Other Ambulatory Visit: Payer: Self-pay | Admitting: Family Medicine

## 2023-05-09 DIAGNOSIS — E785 Hyperlipidemia, unspecified: Secondary | ICD-10-CM

## 2023-05-14 ENCOUNTER — Ambulatory Visit: Payer: Commercial Managed Care - PPO | Admitting: Family Medicine

## 2023-05-14 ENCOUNTER — Encounter: Payer: Self-pay | Admitting: Family Medicine

## 2023-05-14 VITALS — BP 139/89 | HR 74 | Temp 97.5°F | Ht 70.0 in | Wt 291.0 lb

## 2023-05-14 DIAGNOSIS — M1000 Idiopathic gout, unspecified site: Secondary | ICD-10-CM

## 2023-05-14 DIAGNOSIS — I10 Essential (primary) hypertension: Secondary | ICD-10-CM | POA: Diagnosis not present

## 2023-05-14 DIAGNOSIS — Z23 Encounter for immunization: Secondary | ICD-10-CM

## 2023-05-14 DIAGNOSIS — E785 Hyperlipidemia, unspecified: Secondary | ICD-10-CM

## 2023-05-14 DIAGNOSIS — M5416 Radiculopathy, lumbar region: Secondary | ICD-10-CM

## 2023-05-14 DIAGNOSIS — N400 Enlarged prostate without lower urinary tract symptoms: Secondary | ICD-10-CM

## 2023-05-14 DIAGNOSIS — M21372 Foot drop, left foot: Secondary | ICD-10-CM

## 2023-05-14 DIAGNOSIS — Z1211 Encounter for screening for malignant neoplasm of colon: Secondary | ICD-10-CM

## 2023-05-14 DIAGNOSIS — M21371 Foot drop, right foot: Secondary | ICD-10-CM

## 2023-05-14 MED ORDER — PREGABALIN 225 MG PO CAPS: 225.0000 mg | ORAL_CAPSULE | Freq: Two times a day (BID) | ORAL | 3 refills | Status: AC

## 2023-05-14 MED ORDER — CELECOXIB 400 MG PO CAPS: 400.0000 mg | ORAL_CAPSULE | Freq: Every day | ORAL | 0 refills | Status: DC

## 2023-05-14 MED ORDER — ATORVASTATIN CALCIUM 20 MG PO TABS: 20.0000 mg | ORAL_TABLET | Freq: Every day | ORAL | 0 refills | Status: DC

## 2023-05-14 MED ORDER — AMLODIPINE BESYLATE 10 MG PO TABS
ORAL_TABLET | ORAL | 3 refills | Status: DC
Start: 2023-05-14 — End: 2023-09-04

## 2023-05-14 MED ORDER — CARVEDILOL 12.5 MG PO TABS: 12.5000 mg | ORAL_TABLET | Freq: Two times a day (BID) | ORAL | 3 refills | Status: AC

## 2023-05-14 MED ORDER — HYDROCHLOROTHIAZIDE 25 MG PO TABS: 25.0000 mg | ORAL_TABLET | Freq: Every day | ORAL | 0 refills | Status: DC

## 2023-05-14 MED ORDER — ALLOPURINOL 100 MG PO TABS
200.0000 mg | ORAL_TABLET | Freq: Every day | ORAL | 0 refills | Status: DC
Start: 2023-05-14 — End: 2023-06-08

## 2023-05-14 NOTE — Progress Notes (Signed)
 Subjective:  Patient ID: Frank Herrera, male    DOB: 07-27-1965  Age: 58 y.o. MRN: [id number]  CC: gait (Pt reports that he tenses up when he is out in public because he is scared of falling and it effects gait. Walks much better at home when relaxed. )   HPI Frank Herrera presents for feeling tense in public. Does well if wife is with him. Worried he will fall, embarrassed or injured. When he goes down he "falls soft." Frank Herrera helps him go down easier. Has back problems and drop foot. Surgery fr back last summer. Wears braces for dropfeet to prevent tripping.    presents for  follow-up of hypertension. Patient has no history of headache chest pain or shortness of breath or recent cough. Patient also denies symptoms of TIA such as focal numbness or weakness. Patient denies side effects from medication. States taking it regularly.   in for follow-up of elevated cholesterol. Doing well without complaints on current medication. Denies side effects of statin including myalgia and arthralgia and nausea. Currently no chest pain, shortness of breath or other cardiovascular related symptoms noted.  Denies recent gout flare. He continues to take allopurinol.       05/14/2023    8:32 AM 06/02/2022    1:19 PM 06/02/2022    1:11 PM  Depression screen PHQ 2/9  Decreased Interest 0 0 0  Down, Depressed, Hopeless 0 0 0  PHQ - 2 Score 0 0 0  Altered sleeping 0 0   Tired, decreased energy 1 1   Change in appetite 0    Feeling bad or failure about yourself  0 0   Trouble concentrating 0 0   Moving slowly or fidgety/restless 1 1   Suicidal thoughts 0 0   PHQ-9 Score 2 2   Difficult doing work/chores Not difficult at all Not difficult at all     History Frank Herrera has a past medical history of Hypertension and Lead exposure (03/19/2015).   He has a past surgical history that includes Spine surgery (2010) and Hernia repair (1990).   His family history includes Heart disease in his father; Hypertension in  his brother, father, mother, and sister; Kidney disease in his father.He reports that he has never smoked. He has never used smokeless tobacco. He reports current alcohol use. He reports that he does not use drugs.    ROS Review of Systems  Objective:  BP 139/89   Pulse 74   Temp (!) 97.5 F (36.4 C)   Ht 5\' 10"  (1.778 m)   Wt 291 lb (132 kg)   SpO2 94%   BMI 41.75 kg/m   BP Readings from Last 3 Encounters:  05/14/23 139/89  06/02/22 138/87  04/30/22 133/80    Wt Readings from Last 3 Encounters:  05/14/23 291 lb (132 kg)  06/02/22 292 lb 6.4 oz (132.6 kg)  04/30/22 293 lb (132.9 kg)     Physical Exam Vitals reviewed.  Constitutional:      Appearance: He is well-developed.  HENT:     Head: Normocephalic and atraumatic.     Right Ear: External ear normal.     Left Ear: External ear normal.     Mouth/Throat:     Pharynx: No oropharyngeal exudate or posterior oropharyngeal erythema.  Eyes:     Pupils: Pupils are equal, round, and reactive to light.  Cardiovascular:     Rate and Rhythm: Normal rate and regular rhythm.     Heart sounds:  No murmur heard. Pulmonary:     Effort: No respiratory distress.     Breath sounds: Normal breath sounds.  Musculoskeletal:        General: Deformity (gait is widebased on the right, laxity ofRLE noted. USes a cane) present. No tenderness.     Cervical back: Normal range of motion and neck supple.  Skin:    General: Skin is warm and dry.  Neurological:     Mental Status: He is alert and oriented to person, place, and time.       Assessment & Plan:   Frank Herrera was seen today for gait.  Diagnoses and all orders for this visit:  Encounter for immunization -     Flu vaccine trivalent PF, 6mos and older(Flulaval,Afluria,Fluarix,Fluzone)  Idiopathic gout, unspecified chronicity, unspecified site  Essential hypertension  Lumbar radiculopathy, right  Hyperlipidemia, unspecified hyperlipidemia type       I am having  Frank Herrera. Wimmer maintain his sildenafil, amLODipine, carvedilol, ALPRAZolam, pregabalin, Vitamin D (Ergocalciferol), allopurinol, hydrochlorothiazide, celecoxib, and atorvastatin.  Allergies as of 05/14/2023   No Known Allergies      Medication List        Accurate as of May 14, 2023  8:42 AM. If you have any questions, ask your nurse or doctor.          allopurinol 100 MG tablet Commonly known as: ZYLOPRIM Take 2 tablets (200 mg total) by mouth daily. **NEEDS TO BE SEEN BEFORE NEXT REFILL**   ALPRAZolam 1 MG tablet Commonly known as: XANAX Take one tablet one hour before MRI   amLODipine 10 MG tablet Commonly known as: NORVASC TAKE 1 TABLET (10 MG TOTAL) BY MOUTH DAILY.FOR BLOOD PRESSURE   atorvastatin 20 MG tablet Commonly known as: LIPITOR Take 1 tablet (20 mg total) by mouth daily.   carvedilol 12.5 MG tablet Commonly known as: COREG Take 1 tablet (12.5 mg total) by mouth 2 (two) times daily.   celecoxib 400 MG capsule Commonly known as: CELEBREX Take 1 capsule (400 mg total) by mouth daily. With food **NEEDS TO BE SEEN BEFORE NEXT REFILL**   hydrochlorothiazide 25 MG tablet Commonly known as: HYDRODIURIL Take 1 tablet (25 mg total) by mouth daily. **NEEDS TO BE SEEN BEFORE NEXT REFILL**   pregabalin 225 MG capsule Commonly known as: LYRICA Take 1 capsule (225 mg total) by mouth 2 (two) times daily.   sildenafil 20 MG tablet Commonly known as: REVATIO TAKE 2-5 PILLS AT ONCE, ORALLY, WITH EACH SEXUAL ENCOUNTER   Vitamin D (Ergocalciferol) 1.25 MG (50000 UNIT) Caps capsule Commonly known as: DRISDOL TAKE 1 CAPSULE (50,000 UNITS TOTAL) BY MOUTH EVERY 7 (SEVEN) DAYS         Follow-up: No follow-ups on file.  Mechele Claude, M.D.

## 2023-05-15 LAB — CBC WITH DIFFERENTIAL/PLATELET
Basophils Absolute: 0 10*3/uL (ref 0.0–0.2)
Basos: 0 %
EOS (ABSOLUTE): 0.1 10*3/uL (ref 0.0–0.4)
Eos: 3 %
Hematocrit: 38.7 % (ref 37.5–51.0)
Hemoglobin: 12.8 g/dL — ABNORMAL LOW (ref 13.0–17.7)
Immature Grans (Abs): 0 10*3/uL (ref 0.0–0.1)
Immature Granulocytes: 0 %
Lymphocytes Absolute: 1.6 10*3/uL (ref 0.7–3.1)
Lymphs: 35 %
MCH: 29.8 pg (ref 26.6–33.0)
MCHC: 33.1 g/dL (ref 31.5–35.7)
MCV: 90 fL (ref 79–97)
Monocytes Absolute: 0.5 10*3/uL (ref 0.1–0.9)
Monocytes: 10 %
Neutrophils Absolute: 2.5 10*3/uL (ref 1.4–7.0)
Neutrophils: 52 %
Platelets: 269 10*3/uL (ref 150–450)
RBC: 4.3 x10E6/uL (ref 4.14–5.80)
RDW: 12 % (ref 11.6–15.4)
WBC: 4.7 10*3/uL (ref 3.4–10.8)

## 2023-05-15 LAB — CMP14+EGFR
ALT: 26 IU/L (ref 0–44)
AST: 29 IU/L (ref 0–40)
Albumin: 4.6 g/dL (ref 3.8–4.9)
Alkaline Phosphatase: 102 IU/L (ref 44–121)
BUN/Creatinine Ratio: 13 (ref 9–20)
BUN: 11 mg/dL (ref 6–24)
Bilirubin Total: 0.5 mg/dL (ref 0.0–1.2)
CO2: 24 mmol/L (ref 20–29)
Calcium: 9.3 mg/dL (ref 8.7–10.2)
Chloride: 101 mmol/L (ref 96–106)
Creatinine, Ser: 0.85 mg/dL (ref 0.76–1.27)
Globulin, Total: 2.3 g/dL (ref 1.5–4.5)
Glucose: 102 mg/dL — ABNORMAL HIGH (ref 70–99)
Potassium: 3.3 mmol/L — ABNORMAL LOW (ref 3.5–5.2)
Sodium: 140 mmol/L (ref 134–144)
Total Protein: 6.9 g/dL (ref 6.0–8.5)
eGFR: 103 mL/min/{1.73_m2} (ref >59)

## 2023-05-15 LAB — LIPID PANEL
Chol/HDL Ratio: 2.3 ratio (ref 0.0–5.0)
Cholesterol, Total: 98 mg/dL — ABNORMAL LOW (ref 100–199)
HDL: 42 mg/dL (ref >39)
LDL Chol Calc (NIH): 42 mg/dL (ref 0–99)
Triglycerides: 65 mg/dL (ref 0–149)
VLDL Cholesterol Cal: 14 mg/dL (ref 5–40)

## 2023-05-15 LAB — PSA, TOTAL AND FREE
PSA, Free Pct: 14.3 %
PSA, Free: 0.1 ng/mL
Prostate Specific Ag, Serum: 0.7 ng/mL (ref 0.0–4.0)

## 2023-05-17 ENCOUNTER — Encounter: Payer: Self-pay | Admitting: Family Medicine

## 2023-05-17 NOTE — Progress Notes (Signed)
Hello Paula,  Your lab result is normal and/or stable.Some minor variations that are not significant are commonly marked abnormal, but do not represent any medical problem for you.  Best regards, Warren Stacks, M.D.

## 2023-06-06 ENCOUNTER — Other Ambulatory Visit: Payer: Self-pay | Admitting: Family Medicine

## 2023-06-06 DIAGNOSIS — E785 Hyperlipidemia, unspecified: Secondary | ICD-10-CM

## 2023-06-06 DIAGNOSIS — M1000 Idiopathic gout, unspecified site: Secondary | ICD-10-CM

## 2023-06-10 ENCOUNTER — Other Ambulatory Visit: Payer: Self-pay | Admitting: Family Medicine

## 2023-06-10 DIAGNOSIS — I10 Essential (primary) hypertension: Secondary | ICD-10-CM

## 2023-07-05 ENCOUNTER — Other Ambulatory Visit: Payer: Self-pay | Admitting: Family Medicine

## 2023-07-05 DIAGNOSIS — M5416 Radiculopathy, lumbar region: Secondary | ICD-10-CM

## 2023-09-03 ENCOUNTER — Other Ambulatory Visit: Payer: Self-pay | Admitting: Family Medicine

## 2023-09-03 DIAGNOSIS — I10 Essential (primary) hypertension: Secondary | ICD-10-CM

## 2023-09-04 ENCOUNTER — Other Ambulatory Visit: Payer: Self-pay | Admitting: Family Medicine

## 2023-09-04 DIAGNOSIS — M1000 Idiopathic gout, unspecified site: Secondary | ICD-10-CM

## 2023-10-08 ENCOUNTER — Ambulatory Visit

## 2023-10-08 VITALS — BP 135/85 | HR 80 | Ht 70.0 in | Wt 291.0 lb

## 2023-10-08 DIAGNOSIS — Z0000 Encounter for general adult medical examination without abnormal findings: Secondary | ICD-10-CM | POA: Diagnosis not present

## 2023-10-08 NOTE — Progress Notes (Signed)
 Subjective:   Frank Herrera is a 58 y.o. who presents for a Medicare Wellness preventive visit.  As a reminder, Annual Wellness Visits don't include a physical exam, and some assessments may be limited, especially if this visit is performed virtually. We may recommend an in-person follow-up visit with your provider if needed.  Visit Complete: Virtual I connected with  Elspeth JONETTA Lunger on 10/08/23 by a audio enabled telemedicine application and verified that I am speaking with the correct person using two identifiers.  Patient Location: Home  Provider Location: Home Office  I discussed the limitations of evaluation and management by telemedicine. The patient expressed understanding and agreed to proceed.  Vital Signs: Because this visit was a virtual/telehealth visit, some criteria may be missing or patient reported. Any vitals not documented were not able to be obtained and vitals that have been documented are patient reported.  VideoDeclined- This patient declined Librarian, academic. Therefore the visit was completed with audio only.  Persons Participating in Visit: Patient.  AWV Questionnaire: No: Patient Medicare AWV questionnaire was not completed prior to this visit.  Cardiac Risk Factors include: advanced age (>52men, >65 women);dyslipidemia;male gender;obesity (BMI >30kg/m2)     Objective:    Today's Vitals   10/08/23 1425  BP: 135/85  Pulse: 80  Weight: 291 lb (132 kg)  Height: 5' 10 (1.778 m)   Body mass index is 41.75 kg/m.     10/08/2023    2:30 PM 10/15/2022   10:00 AM 04/17/2015   12:23 PM  Advanced Directives  Does Patient Have a Medical Advance Directive? No No No      Data saved with a previous flowsheet row definition    Current Medications (verified) Outpatient Encounter Medications as of 10/08/2023  Medication Sig   allopurinol  (ZYLOPRIM ) 100 MG tablet TAKE 2 TABLETS BY MOUTH EVERY DAY   ALPRAZolam  (XANAX ) 1 MG tablet  Take one tablet one hour before MRI   amLODipine  (NORVASC ) 10 MG tablet TAKE 1 TABLET (10 MG TOTAL) BY MOUTH DAILY.FOR BLOOD PRESSURE   atorvastatin  (LIPITOR) 20 MG tablet TAKE 1 TABLET BY MOUTH EVERY DAY   carvedilol  (COREG ) 12.5 MG tablet Take 1 tablet (12.5 mg total) by mouth 2 (two) times daily.   celecoxib  (CELEBREX ) 400 MG capsule Take 1 capsule (400 mg total) by mouth daily. With food   hydrochlorothiazide  (HYDRODIURIL ) 25 MG tablet Take 1 tablet (25 mg total) by mouth daily.   pregabalin  (LYRICA ) 225 MG capsule Take 1 capsule (225 mg total) by mouth 2 (two) times daily.   sildenafil  (REVATIO ) 20 MG tablet TAKE 2-5 PILLS AT ONCE, ORALLY, WITH EACH SEXUAL ENCOUNTER   Vitamin D , Ergocalciferol , (DRISDOL ) 1.25 MG (50000 UNIT) CAPS capsule TAKE 1 CAPSULE (50,000 UNITS TOTAL) BY MOUTH EVERY 7 (SEVEN) DAYS   No facility-administered encounter medications on file as of 10/08/2023.    Allergies (verified) Patient has no known allergies.   History: Past Medical History:  Diagnosis Date   Hypertension    Lead exposure 03/19/2015   Past Surgical History:  Procedure Laterality Date   HERNIA REPAIR  1990   SPINE SURGERY  2010   Family History  Problem Relation Age of Onset   Hypertension Mother    Kidney disease Father    Hypertension Father    Heart disease Father    Hypertension Brother    Hypertension Sister    Neuropathy Neg Hx    Social History   Socioeconomic History   Marital  status: Single    Spouse name: Not on file   Number of children: 0   Years of education: 12   Highest education level: Not on file  Occupational History   Occupation: [employer]  Tobacco Use   Smoking status: Never   Smokeless tobacco: Never  Vaping Use   Vaping status: Never Used  Substance and Sexual Activity   Alcohol use: Yes    Comment: Twice per month   Drug use: No   Sexual activity: Not on file  Other Topics Concern   Not on file  Social History Narrative   Lives with mother    Caffeine use: Tea/soda- sometimes   Drinks mostly water   Right handed   Social Drivers of Health   Financial Resource Strain: Low Risk  (10/08/2023)   Overall Financial Resource Strain (CARDIA)    Difficulty of Paying Living Expenses: Not hard at all  Food Insecurity: No Food Insecurity (10/08/2023)   Hunger Vital Sign    Worried About Running Out of Food in the Last Year: Never true    Ran Out of Food in the Last Year: Never true  Transportation Needs: No Transportation Needs (10/08/2023)   PRAPARE - Administrator, Civil Service (Medical): No    Lack of Transportation (Non-Medical): No  Physical Activity: Sufficiently Active (10/08/2023)   Exercise Vital Sign    Days of Exercise per Week: 7 days    Minutes of Exercise per Session: 30 min  Stress: No Stress Concern Present (10/08/2023)   [employer] of Occupational Health - Occupational Stress Questionnaire    Feeling of Stress: Not at all  Social Connections: Socially Isolated (10/08/2023)   Social Connection and Isolation Panel    Frequency of Communication with Friends and Family: Once a week    Frequency of Social Gatherings with Friends and Family: Once a week    Attends Religious Services: Never    Database administrator or Organizations: No    Attends Engineer, structural: Never    Marital Status: Married    Tobacco Counseling Counseling given: Yes    Clinical Intake:  Pre-visit preparation completed: Yes  Pain : No/denies pain     BMI - recorded: 41.75 Nutritional Status: BMI > 30  Obese Nutritional Risks: None Diabetes: No  Lab Results  Component Value Date   HGBA1C 5.2 01/12/2018     How often do you need to have someone help you when you read instructions, pamphlets, or other written materials from your doctor or pharmacy?: 1 - Never  Interpreter Needed?: No  Information entered by :: alia t/cma   Activities of Daily Living     10/08/2023    2:28 PM  In your  present state of health, do you have any difficulty performing the following activities:  Hearing? 0  Vision? 0  Difficulty concentrating or making decisions? 0  Walking or climbing stairs? 0  Dressing or bathing? 0  Doing errands, shopping? 0  Preparing Food and eating ? N  Using the Toilet? N  In the past six months, have you accidently leaked urine? N  Do you have problems with loss of bowel control? N  Managing your Medications? N  Managing your Finances? N  Housekeeping or managing your Housekeeping? N    Patient Care Team: Zollie Lowers, MD as PCP - General (Family Medicine) Sheldon Standing, MD as Consulting Physician (General Surgery)  I have updated your Care Teams any recent Medical Services  you may have received from other providers in the past year.     Assessment:   This is a routine wellness examination for Tarik.  Hearing/Vision screen Hearing Screening - Comments:: Pt denies hearing dif Vision Screening - Comments:: Pt wear glasses/pt goes to [facility] in [place]/last ov 08/2022   Goals Addressed             This Visit's Progress    Patient Stated       Pt would like to paint the garage floor painted        Depression Screen     10/08/2023    2:33 PM 05/14/2023    8:32 AM 06/02/2022    1:19 PM 06/02/2022    1:11 PM 04/30/2022    9:27 AM 04/30/2022    9:11 AM 10/28/2021    1:02 PM  PHQ 2/9 Scores  PHQ - 2 Score 0 0 0 0 1 0 0  PHQ- 9 Score 0 2 2  3       Fall Risk     10/08/2023    2:26 PM 06/02/2022    1:11 PM 04/30/2022    9:27 AM 04/30/2022    9:11 AM 10/28/2021    1:02 PM  Fall Risk   Falls in the past year? 1 1 1  0 1  Number falls in past yr: 0 1 1  1   Injury with Fall? 0 0 0  0  Risk for fall due to : Impaired balance/gait;Impaired mobility History of fall(s);Impaired balance/gait History of fall(s);Impaired balance/gait  History of fall(s);Impaired balance/gait;Impaired mobility;Orthopedic patient  Follow up  Falls evaluation completed Falls  evaluation completed  Falls evaluation completed      Data saved with a previous flowsheet row definition    MEDICARE RISK AT HOME:  Medicare Risk at Home Any stairs in or around the home?: Yes If so, are there any without handrails?: Yes Home free of loose throw rugs in walkways, pet beds, electrical cords, etc?: Yes Adequate lighting in your home to reduce risk of falls?: Yes Life alert?: No Use of a cane, walker or w/c?: Yes Grab bars in the bathroom?: Yes Shower chair or bench in shower?: Yes Elevated toilet seat or a handicapped toilet?: Yes  TIMED UP AND GO:  Was the test performed?  no  Cognitive Function: 6CIT completed        10/08/2023    2:34 PM 10/28/2021    1:07 PM  6CIT Screen  What Year? 0 points 0 points  What month? 0 points 0 points  What time? 0 points 0 points  Count back from 20 0 points 0 points  Months in reverse 0 points 0 points  Repeat phrase 0 points 0 points  Total Score 0 points 0 points    Immunizations Immunization History  Administered Date(s) Administered   Influenza, Seasonal, Injecte, Preservative Fre 05/14/2023   Influenza,inj,Quad PF,6+ Mos 02/22/2014, 02/04/2016, 12/11/2016, 01/12/2018, 04/13/2019, 04/05/2020, 03/18/2021   Moderna Sars-Covid-2 Vaccination 05/23/2019, 06/20/2019, 01/27/2020   Tdap 06/22/2013, 11/06/2018   Zoster Recombinant(Shingrix ) 03/18/2021, 09/16/2021    Screening Tests Health Maintenance  Topic Date Due   Hepatitis B Vaccines (1 of 3 - 19+ 3-dose series) Never done   Pneumococcal Vaccine: 50+ Years (1 of 1 - PCV) Never done   COVID-19 Vaccine (4 - 2024-25 season) 11/09/2022   INFLUENZA VACCINE  10/09/2023   Medicare Annual Wellness (AWV)  10/07/2024   DTaP/Tdap/Td (3 - Td or Tdap) 11/05/2028   Colonoscopy  06/28/2033  Hepatitis C Screening  Completed   HIV Screening  Completed   Zoster Vaccines- Shingrix   Completed   HPV VACCINES  Aged Out   Meningococcal B Vaccine  Aged Out    Health  Maintenance  Health Maintenance Due  Topic Date Due   Hepatitis B Vaccines (1 of 3 - 19+ 3-dose series) Never done   Pneumococcal Vaccine: 50+ Years (1 of 1 - PCV) Never done   COVID-19 Vaccine (4 - 2024-25 season) 11/09/2022   Health Maintenance Items Addressed: See Nurse Notes at the end of this note  Additional Screening:  Vision Screening: Recommended annual ophthalmology exams for early detection of glaucoma and other disorders of the eye. Would you like a referral to an eye doctor? No    Dental Screening: Recommended annual dental exams for proper oral hygiene  Community Resource Referral / Chronic Care Management: CRR required this visit?  No   CCM required this visit?  No   Plan:    I have personally reviewed and noted the following in the patient's chart:   Medical and social history Use of alcohol, tobacco or illicit drugs  Current medications and supplements including opioid prescriptions. Patient is not currently taking opioid prescriptions. Functional ability and status Nutritional status Physical activity Advanced directives List of other physicians Hospitalizations, surgeries, and ER visits in previous 12 months Vitals Screenings to include cognitive, depression, and falls Referrals and appointments  In addition, I have reviewed and discussed with patient certain preventive protocols, quality metrics, and best practice recommendations. A written personalized care plan for preventive services as well as general preventive health recommendations were provided to patient.   Ozie Ned, CMA   10/08/2023   After Visit Summary: (MyChart) Due to this being a telephonic visit, the after visit summary with patients personalized plan was offered to patient via MyChart   Notes: PCP Follow Up Recommendations: Pt is aware and due for the following: Pneumonia vaccine, Hep B Vaccine

## 2023-10-08 NOTE — Patient Instructions (Signed)
 Frank Herrera , Thank you for taking time out of your busy schedule to complete your Annual Wellness Visit with me. I enjoyed our conversation and look forward to speaking with you again next year. I, as well as your care team,  appreciate your ongoing commitment to your health goals. Please review the following plan we discussed and let me know if I can assist you in the future. Your Game plan/ To Do List    Referrals: If you haven't heard from the office you've been referred to, please reach out to them at the phone provided.   Follow up Visits: We will see or speak with you next year for your Next Medicare AWV with our clinical staff on 10/10/24 at 2:30p.m. Have you seen your provider in the last 6 months (3 months if uncontrolled diabetes)? Yes  Clinician Recommendations:  Aim for 30 minutes of exercise or brisk walking, 6-8 glasses of water, and 5 servings of fruits and vegetables each day.       This is a list of the screenings recommended for you:  Health Maintenance  Topic Date Due   Hepatitis B Vaccine (1 of 3 - 19+ 3-dose series) Never done   Pneumococcal Vaccine for age over 89 (1 of 1 - PCV) Never done   Medicare Annual Wellness Visit  10/29/2022   COVID-19 Vaccine (4 - 2024-25 season) 11/09/2022   Flu Shot  10/09/2023   DTaP/Tdap/Td vaccine (3 - Td or Tdap) 11/05/2028   Colon Cancer Screening  06/28/2033   Hepatitis C Screening  Completed   HIV Screening  Completed   Zoster (Shingles) Vaccine  Completed   HPV Vaccine  Aged Out   Meningitis B Vaccine  Aged Out    Advanced directives: (Declined) Advance directive discussed with you today. Even though you declined this today, please call our office should you change your mind, and we can give you the proper paperwork for you to fill out. Advance Care Planning is important because it:  [x]  Makes sure you receive the medical care that is consistent with your values, goals, and preferences  [x]  It provides guidance to your family  and loved ones and reduces their decisional burden about whether or not they are making the right decisions based on your wishes.  Follow the link provided in your after visit summary or read over the paperwork we have mailed to you to help you started getting your Advance Directives in place. If you need assistance in completing these, please reach out to us  so that we can help you!  See attachments for Preventive Care and Fall Prevention Tips.

## 2023-11-18 ENCOUNTER — Encounter: Admitting: Family Medicine

## 2023-11-29 ENCOUNTER — Other Ambulatory Visit: Payer: Self-pay | Admitting: Family Medicine

## 2023-11-29 DIAGNOSIS — I10 Essential (primary) hypertension: Secondary | ICD-10-CM

## 2023-12-09 ENCOUNTER — Ambulatory Visit (INDEPENDENT_AMBULATORY_CARE_PROVIDER_SITE_OTHER): Admitting: Family Medicine

## 2023-12-09 ENCOUNTER — Encounter: Payer: Self-pay | Admitting: Family Medicine

## 2023-12-09 VITALS — BP 117/73 | HR 72 | Temp 98.3°F | Ht 70.0 in | Wt 293.6 lb

## 2023-12-09 DIAGNOSIS — M21372 Foot drop, left foot: Secondary | ICD-10-CM

## 2023-12-09 DIAGNOSIS — M21371 Foot drop, right foot: Secondary | ICD-10-CM | POA: Diagnosis not present

## 2023-12-09 DIAGNOSIS — Z23 Encounter for immunization: Secondary | ICD-10-CM

## 2023-12-09 DIAGNOSIS — E559 Vitamin D deficiency, unspecified: Secondary | ICD-10-CM

## 2023-12-09 DIAGNOSIS — N400 Enlarged prostate without lower urinary tract symptoms: Secondary | ICD-10-CM

## 2023-12-09 DIAGNOSIS — M6259 Muscle wasting and atrophy, not elsewhere classified, multiple sites: Secondary | ICD-10-CM

## 2023-12-09 DIAGNOSIS — M5416 Radiculopathy, lumbar region: Secondary | ICD-10-CM

## 2023-12-09 DIAGNOSIS — I10 Essential (primary) hypertension: Secondary | ICD-10-CM

## 2023-12-09 NOTE — Progress Notes (Signed)
 Subjective:  Patient ID: Frank Herrera, male    DOB: 1965/09/15  Age: 58 y.o. MRN: [id number]  CC: Annual Exam   HPI  Discussed the use of AI scribe software for clinical note transcription with the patient, who gave verbal consent to proceed.  History of Present Illness Frank Herrera is a 58 year old male who presents for an annual physical exam.  He has a history of hypertension and hyperlipidemia, managed with hydrochlorothiazide , amlodipine , atorvastatin , and carvedilol .  He experiences chronic muscle and joint pain, particularly in the left leg, for which he takes Celebrex  400 mg once daily. The pain relief is moderate, and he also takes Lyrica  at night, which aids in sleep and makes the pain more tolerable, rating it as 5-6 on a pain scale of 1 to 10.  He has a history of back surgery and hernia repair in 2019. He is currently on disability and not working, attributing this to his previous work environment and surgeries.  He has a condition of drop foot, for which he uses braces. He reports difficulty in lifting his toes but can apply pressure downward. Occasional arthritis pain is noted in the left knee.  He mentions a hydrocele that does not cause pain or affect function. He has not sought specialist consultation as it has not been bothersome.  He is married and reports a good family life. No issues with urination or significant nocturia are present. His sex life is described as 'it's there' and 'it does what it does'.  He inquires about testosterone levels, expressing interest in understanding its impact on energy and other bodily functions.  No chest pain, headaches, shortness of breath, heartburn, or diarrhea.          12/09/2023    1:04 PM 10/08/2023    2:33 PM 05/14/2023    8:32 AM  Depression screen PHQ 2/9  Decreased Interest 0 0 0  Down, Depressed, Hopeless 0 0 0  PHQ - 2 Score 0 0 0  Altered sleeping  0 0  Tired, decreased energy  0 1  Change in appetite   0 0  Feeling bad or failure about yourself   0 0  Trouble concentrating  0 0  Moving slowly or fidgety/restless  0 1  Suicidal thoughts  0 0  PHQ-9 Score  0 2  Difficult doing work/chores  Not difficult at all Not difficult at all    History [name] has a past medical history of Hypertension and Lead exposure (03/19/2015).   He has a past surgical history that includes Spine surgery (2010) and Hernia repair (1990).   His family history includes Heart disease in his father; Hypertension in his brother, father, mother, and sister; Kidney disease in his father.He reports that he has never smoked. He has never used smokeless tobacco. He reports current alcohol use. He reports that he does not use drugs.    ROS Review of Systems  Constitutional: Negative.  Negative for activity change, fatigue and unexpected weight change.  HENT: Negative.  Negative for congestion, ear pain, hearing loss, postnasal drip and trouble swallowing.   Eyes:  Negative for pain and visual disturbance.  Respiratory:  Negative for cough, chest tightness and shortness of breath.   Cardiovascular:  Negative for chest pain, palpitations and leg swelling.  Gastrointestinal:  Negative for abdominal distention, abdominal pain, blood in stool, constipation, diarrhea, nausea and vomiting.  Endocrine: Negative for cold intolerance, heat intolerance and polydipsia.  Genitourinary:  Negative for  difficulty urinating, dysuria, flank pain, frequency and urgency.  Musculoskeletal:  Negative for arthralgias, joint swelling and myalgias.  Skin:  Negative for color change, rash and wound.  Neurological:  Negative for dizziness, syncope, speech difficulty, weakness, light-headedness, numbness and headaches.  Hematological:  Does not bruise/bleed easily.  Psychiatric/Behavioral:  Negative for confusion, decreased concentration, dysphoric mood and sleep disturbance. The patient is not nervous/anxious.     Objective:  BP 117/73    Pulse 72   Temp 98.3 F (36.8 C)   Ht 5' 10 (1.778 m)   Wt 293 lb 9.6 oz (133.2 kg)   SpO2 96%   BMI 42.13 kg/m   BP Readings from Last 3 Encounters:  12/09/23 117/73  10/08/23 135/85  05/14/23 139/89    Wt Readings from Last 3 Encounters:  12/09/23 293 lb 9.6 oz (133.2 kg)  10/08/23 291 lb (132 kg)  05/14/23 291 lb (132 kg)     Physical Exam Physical Exam GENERAL: Alert, cooperative, well developed, no acute distress. HEENT: Normocephalic, normal oropharynx, moist mucous membranes, pupils equal, reactive to light, extraocular movements intact. NECK: No lymphadenopathy or swelling. CHEST: Clear to auscultation bilaterally, no wheezes, rhonchi, or crackles. CARDIOVASCULAR: Normal heart rate and rhythm, S1 and S2 normal without murmurs. ABDOMEN: Soft, non-tender, non-distended, without organomegaly, normal bowel sounds. GENITOURINARY: Hydrocele present causing scrotal swelling. EXTREMITIES: No cyanosis or edema. MUSCULOSKELETAL: Hips with full range of motion, knees non-tender, full range of motion in ankles except for drop foot. NEUROLOGICAL: Cranial nerves grossly intact, moves all extremities without gross motor or sensory deficit. SKIN: No moles on back, blackheads present on back.   Assessment & Plan:  Essential hypertension -     CBC with Differential/Platelet -     CMP14+EGFR -     Lipid panel -     Urinalysis  Encounter for immunization -     Flu vaccine trivalent PF, 6mos and older(Flulaval,Afluria,Fluarix,Fluzone) -     CBC with Differential/Platelet -     CMP14+EGFR  Bilateral foot-drop -     CBC with Differential/Platelet -     CMP14+EGFR  Severe obesity (BMI >= 40) (HCC) -     CBC with Differential/Platelet -     CMP14+EGFR  Lumbar radiculopathy, right -     CBC with Differential/Platelet -     CMP14+EGFR  Benign prostatic hyperplasia without lower urinary tract symptoms -     CBC with Differential/Platelet -     CMP14+EGFR -     PSA, total  and free  Vitamin D  deficiency -     VITAMIN D  25 Hydroxy (Vit-D Deficiency, Fractures)  Atrophy of muscle of multiple sites -     Testosterone,Free and Total    Assessment and Plan Assessment & Plan Chronic left leg pain and numbness with left foot drop   He experiences chronic pain and numbness in the left leg with foot drop, rated at 5-6/10. This is managed with Celebrex  and Lyrica , with Lyrica  effectively aiding sleep at night. He prefers not to increase the dosage due to potential drowsiness.  Left knee pain, likely early osteoarthritis   He has intermittent left knee pain, possibly due to early osteoarthritis. He maintains full range of motion in his hips, knees, and ankles, except for the foot drop. The pain is not severe and does not significantly impact his function.  Essential hypertension   His hypertension is managed with hydrochlorothiazide , amlodipine , and carvedilol .  Hyperlipidemia   His hyperlipidemia is managed with atorvastatin , with no  new concerns reported.  Hydrocele   He has a significant hydrocele without pain or functional impairment. No intervention is needed unless symptoms develop. Monitor for changes in symptoms or function.  General Health Maintenance   He discussed testosterone levels and their impact on energy, mood, muscle mass, and sexual function. Although he has no symptoms of low testosterone, he is interested in checking his levels. Order a testosterone level test to be conducted before 10 AM and schedule a lab appointment for blood work.       Follow-up: Return in about 6 months (around 06/08/2024).  Butler Der, M.D.

## 2023-12-14 ENCOUNTER — Other Ambulatory Visit

## 2023-12-14 LAB — URINALYSIS
Bilirubin, UA: NEGATIVE
Glucose, UA: NEGATIVE
Ketones, UA: NEGATIVE
Leukocytes,UA: NEGATIVE
Nitrite, UA: NEGATIVE
Protein,UA: NEGATIVE
RBC, UA: NEGATIVE
Specific Gravity, UA: 1.025 (ref 1.005–1.030)
Urobilinogen, Ur: 1 mg/dL (ref 0.2–1.0)
pH, UA: 5.5 (ref 5.0–7.5)

## 2023-12-15 ENCOUNTER — Ambulatory Visit: Payer: Self-pay | Admitting: Family Medicine

## 2023-12-15 LAB — CBC WITH DIFFERENTIAL/PLATELET
Basophils Absolute: 0.1 x10E3/uL (ref 0.0–0.2)
Basos: 1 %
EOS (ABSOLUTE): 0.3 x10E3/uL (ref 0.0–0.4)
Eos: 4 %
Hematocrit: 42 % (ref 37.5–51.0)
Hemoglobin: 13.6 g/dL (ref 13.0–17.7)
Immature Grans (Abs): 0 x10E3/uL (ref 0.0–0.1)
Immature Granulocytes: 0 %
Lymphocytes Absolute: 2.4 x10E3/uL (ref 0.7–3.1)
Lymphs: 35 %
MCH: 30 pg (ref 26.6–33.0)
MCHC: 32.4 g/dL (ref 31.5–35.7)
MCV: 93 fL (ref 79–97)
Monocytes Absolute: 0.7 x10E3/uL (ref 0.1–0.9)
Monocytes: 11 %
Neutrophils Absolute: 3.5 x10E3/uL (ref 1.4–7.0)
Neutrophils: 49 %
Platelets: 300 x10E3/uL (ref 150–450)
RBC: 4.53 x10E6/uL (ref 4.14–5.80)
RDW: 12.3 % (ref 11.6–15.4)
WBC: 6.9 x10E3/uL (ref 3.4–10.8)

## 2023-12-15 LAB — CMP14+EGFR
ALT: 25 IU/L (ref 0–44)
AST: 26 IU/L (ref 0–40)
Albumin: 4.7 g/dL (ref 3.8–4.9)
Alkaline Phosphatase: 130 IU/L — ABNORMAL HIGH (ref 47–123)
BUN/Creatinine Ratio: 14 (ref 9–20)
BUN: 13 mg/dL (ref 6–24)
Bilirubin Total: 0.6 mg/dL (ref 0.0–1.2)
CO2: 25 mmol/L (ref 20–29)
Calcium: 9.7 mg/dL (ref 8.7–10.2)
Chloride: 100 mmol/L (ref 96–106)
Creatinine, Ser: 0.92 mg/dL (ref 0.76–1.27)
Globulin, Total: 2.6 g/dL (ref 1.5–4.5)
Glucose: 108 mg/dL — ABNORMAL HIGH (ref 70–99)
Potassium: 3.8 mmol/L (ref 3.5–5.2)
Sodium: 141 mmol/L (ref 134–144)
Total Protein: 7.3 g/dL (ref 6.0–8.5)
eGFR: 98 mL/min/1.73 (ref >59)

## 2023-12-15 LAB — TESTOSTERONE,FREE AND TOTAL
Testosterone, Free: 8.7 pg/mL (ref 7.2–24.0)
Testosterone: 475 ng/dL (ref 264–916)

## 2023-12-15 LAB — VITAMIN D 25 HYDROXY (VIT D DEFICIENCY, FRACTURES): Vit D, 25-Hydroxy: 26.2 ng/mL — AB (ref 30.0–100.0)

## 2023-12-15 LAB — PSA, TOTAL AND FREE
PSA, Free Pct: 20 %
PSA, Free: 0.14 ng/mL
Prostate Specific Ag, Serum: 0.7 ng/mL (ref 0.0–4.0)

## 2023-12-15 LAB — LIPID PANEL
Chol/HDL Ratio: 2.4 ratio (ref 0.0–5.0)
Cholesterol, Total: 122 mg/dL (ref 100–199)
HDL: 51 mg/dL (ref >39)
LDL Chol Calc (NIH): 55 mg/dL (ref 0–99)
Triglycerides: 81 mg/dL (ref 0–149)
VLDL Cholesterol Cal: 16 mg/dL (ref 5–40)

## 2023-12-15 NOTE — Progress Notes (Signed)
 Dear Frank Herrera, Your Vitamin D  is  low. You need a prescription strength supplement I will send that in for you. Nurse, if at all possible, could you send in a prescription for the patient for vitamin D  50,000 units, 1 p.o. weekly #13 with 3 refills? Many thanks, WS

## 2023-12-16 ENCOUNTER — Other Ambulatory Visit: Payer: Self-pay | Admitting: Family Medicine

## 2023-12-16 DIAGNOSIS — I10 Essential (primary) hypertension: Secondary | ICD-10-CM

## 2023-12-18 ENCOUNTER — Other Ambulatory Visit: Payer: Self-pay | Admitting: Family Medicine

## 2023-12-18 DIAGNOSIS — E559 Vitamin D deficiency, unspecified: Secondary | ICD-10-CM

## 2023-12-18 NOTE — Telephone Encounter (Signed)
 Copied from CRM [id number]. Topic: Clinical - Medication Refill >> Dec 18, 2023  2:51 PM Frank Herrera wrote: Medication: Vitamin D , Ergocalciferol , (DRISDOL ) 1.25 MG (50000 UNIT) CAPS capsule  Has the patient contacted their pharmacy? Yes  This is the patient's preferred pharmacy:  [id number] - [place], [place] - [address] [place] [address] Phone: [phone] Fax: [phone]  Is this the correct pharmacy for this prescription? Yes  Has the prescription been filled recently? Yes  Is the patient out of the medication? Yes  Has the patient been seen for an appointment in the last year OR does the patient have an upcoming appointment? Yes  Can we respond through MyChart? Yes  Agent: Please be advised that Rx refills may take up to 3 business days. We ask that you follow-up with your pharmacy.

## 2023-12-21 MED ORDER — VITAMIN D (ERGOCALCIFEROL) 1.25 MG (50000 UNIT) PO CAPS: 50000.0000 [IU] | ORAL_CAPSULE | ORAL | 1 refills | Status: AC

## 2023-12-24 ENCOUNTER — Other Ambulatory Visit: Payer: Self-pay | Admitting: Family Medicine

## 2023-12-24 DIAGNOSIS — E785 Hyperlipidemia, unspecified: Secondary | ICD-10-CM

## 2024-02-28 ENCOUNTER — Other Ambulatory Visit: Payer: Self-pay | Admitting: Family Medicine

## 2024-02-28 DIAGNOSIS — M1000 Idiopathic gout, unspecified site: Secondary | ICD-10-CM

## 2024-02-28 DIAGNOSIS — I10 Essential (primary) hypertension: Secondary | ICD-10-CM

## 2024-03-18 ENCOUNTER — Telehealth: Payer: Self-pay | Admitting: Family Medicine

## 2024-03-18 NOTE — Telephone Encounter (Signed)
 Copied from CRM #[id number]. Topic: Clinical - Prescription Issue >> Mar 18, 2024  3:19 PM Ivette P wrote: Reason for CRM: Pt would like all medication to go to [facility].   [facility]  Address: [address], [place], [place] [address] Phone: [phone]   Pt has new insurance and updated in the the chart

## 2024-03-18 NOTE — Telephone Encounter (Signed)
 Contacted patient and informed that pharmacy has been updated.instructed patient to call [facility] and request transfer of prescriptions.

## 2024-06-08 ENCOUNTER — Ambulatory Visit: Payer: Self-pay | Admitting: Family Medicine

## 2024-10-10 ENCOUNTER — Ambulatory Visit: Payer: Self-pay
